# Patient Record
Sex: Female | Born: 1990 | Race: White | Hispanic: No | Marital: Single | Smoking: Never smoker
Health system: Southern US, Community
[De-identification: ages and names within clinical notes are randomized; demographics above are authoritative.]

## PROBLEM LIST (undated history)

## (undated) DIAGNOSIS — R569 Unspecified convulsions: Secondary | ICD-10-CM

## (undated) DIAGNOSIS — Q351 Cleft hard palate: Secondary | ICD-10-CM

## (undated) HISTORY — PX: CLEFT PALATE REPAIR: SUR1165

---

## 2010-01-13 ENCOUNTER — Emergency Department (HOSPITAL_COMMUNITY): Admission: EM | Admit: 2010-01-13 | Discharge: 2010-01-13 | Payer: Self-pay | Admitting: Emergency Medicine

## 2010-02-04 ENCOUNTER — Emergency Department (HOSPITAL_COMMUNITY): Admission: EM | Admit: 2010-02-04 | Discharge: 2010-02-04 | Payer: Self-pay | Admitting: Emergency Medicine

## 2010-08-09 ENCOUNTER — Emergency Department (HOSPITAL_COMMUNITY)
Admission: EM | Admit: 2010-08-09 | Discharge: 2010-08-09 | Payer: Self-pay | Source: Home / Self Care | Admitting: Family Medicine

## 2011-02-12 ENCOUNTER — Emergency Department (HOSPITAL_COMMUNITY)
Admission: EM | Admit: 2011-02-12 | Discharge: 2011-02-12 | Disposition: A | Discharge status: Home / Self Care | Payer: Self-pay | Attending: Emergency Medicine | Admitting: Emergency Medicine

## 2011-02-12 ENCOUNTER — Emergency Department (HOSPITAL_COMMUNITY): Payer: Self-pay

## 2011-02-12 DIAGNOSIS — J3489 Other specified disorders of nose and nasal sinuses: Secondary | ICD-10-CM | POA: Insufficient documentation

## 2011-02-12 DIAGNOSIS — R10816 Epigastric abdominal tenderness: Secondary | ICD-10-CM | POA: Insufficient documentation

## 2011-02-12 DIAGNOSIS — R0602 Shortness of breath: Secondary | ICD-10-CM | POA: Insufficient documentation

## 2011-02-12 DIAGNOSIS — R6889 Other general symptoms and signs: Secondary | ICD-10-CM | POA: Insufficient documentation

## 2011-02-12 DIAGNOSIS — R109 Unspecified abdominal pain: Secondary | ICD-10-CM | POA: Insufficient documentation

## 2011-02-12 DIAGNOSIS — R062 Wheezing: Secondary | ICD-10-CM | POA: Insufficient documentation

## 2011-02-12 DIAGNOSIS — R059 Cough, unspecified: Secondary | ICD-10-CM | POA: Insufficient documentation

## 2011-02-12 DIAGNOSIS — R079 Chest pain, unspecified: Secondary | ICD-10-CM | POA: Insufficient documentation

## 2011-02-12 DIAGNOSIS — R6883 Chills (without fever): Secondary | ICD-10-CM | POA: Insufficient documentation

## 2011-02-12 DIAGNOSIS — R1110 Vomiting, unspecified: Secondary | ICD-10-CM | POA: Insufficient documentation

## 2011-02-12 DIAGNOSIS — R55 Syncope and collapse: Secondary | ICD-10-CM | POA: Insufficient documentation

## 2011-02-12 DIAGNOSIS — R05 Cough: Secondary | ICD-10-CM | POA: Insufficient documentation

## 2011-02-12 DIAGNOSIS — J40 Bronchitis, not specified as acute or chronic: Secondary | ICD-10-CM | POA: Insufficient documentation

## 2011-02-12 LAB — PREGNANCY, URINE: Preg Test, Ur: NEGATIVE

## 2011-02-19 ENCOUNTER — Emergency Department (HOSPITAL_COMMUNITY)
Admission: EM | Admit: 2011-02-19 | Discharge: 2011-02-19 | Disposition: A | Discharge status: Home / Self Care | Payer: Self-pay | Attending: Emergency Medicine | Admitting: Emergency Medicine

## 2011-02-19 DIAGNOSIS — Y92009 Unspecified place in unspecified non-institutional (private) residence as the place of occurrence of the external cause: Secondary | ICD-10-CM | POA: Insufficient documentation

## 2011-02-19 DIAGNOSIS — W260XXA Contact with knife, initial encounter: Secondary | ICD-10-CM | POA: Insufficient documentation

## 2011-02-19 DIAGNOSIS — S61209A Unspecified open wound of unspecified finger without damage to nail, initial encounter: Secondary | ICD-10-CM | POA: Insufficient documentation

## 2011-03-21 ENCOUNTER — Inpatient Hospital Stay (INDEPENDENT_AMBULATORY_CARE_PROVIDER_SITE_OTHER)
Admission: RE | Admit: 2011-03-21 | Discharge: 2011-03-21 | Disposition: A | Discharge status: Home / Self Care | Payer: Self-pay | Source: Ambulatory Visit | Attending: Emergency Medicine | Admitting: Emergency Medicine

## 2011-03-21 DIAGNOSIS — Z4802 Encounter for removal of sutures: Secondary | ICD-10-CM

## 2011-07-14 ENCOUNTER — Emergency Department (INDEPENDENT_AMBULATORY_CARE_PROVIDER_SITE_OTHER)
Admission: EM | Admit: 2011-07-14 | Discharge: 2011-07-14 | Disposition: A | Discharge status: Home / Self Care | Payer: Self-pay | Source: Home / Self Care

## 2011-07-14 DIAGNOSIS — R112 Nausea with vomiting, unspecified: Secondary | ICD-10-CM

## 2011-07-14 MED ORDER — ONDANSETRON 4 MG PO TBDP
ORAL_TABLET | ORAL | Status: AC
  Filled 2011-07-14: qty 2

## 2011-07-14 MED ORDER — ONDANSETRON 4 MG PO TBDP
8.0000 mg | ORAL_TABLET | Freq: Once | ORAL | Status: AC
  Administered 2011-07-14: 8 mg via ORAL

## 2011-07-14 MED ORDER — PROMETHAZINE HCL 25 MG PO TABS: 25.0000 mg | ORAL_TABLET | Freq: Four times a day (QID) | ORAL | Status: AC | PRN

## 2011-07-14 NOTE — ED Provider Notes (Signed)
Medical screening examination/treatment/procedure(s) were performed by non-physician practitioner and as supervising physician I was immediately available for consultation/collaboration.  Wolfgang Finigan   Sosha Shepherd Charles Slayden Mennenga, MD 07/14/11 2059 

## 2011-07-14 NOTE — ED Notes (Signed)
Pt states vomiting, chills, fever, headache started 3 days ago.

## 2011-07-14 NOTE — ED Provider Notes (Signed)
History     CSN: [id number]  Arrival date & time 07/14/11  1528   None     Chief Complaint  Patient presents with  . Emesis    vomiting, fever, chills, headache    (Consider location/radiation/quality/duration/timing/severity/associated sxs/prior treatment) HPI Comments: Pt states she had onset of nausea and vomiting yesterday. Last vomited 3-4 hrs ago. Still feels nauseated. Has not had anything to eat or drink in the last few hrs. Denies abd pain or diarrhea. Has been urinating, and denies dysuria or urinary frequency. No abd pain. Has felt feverish and lightheaded.  The history is provided by the patient.    History reviewed. No pertinent past medical history.  History reviewed. No pertinent past surgical history.  History reviewed. No pertinent family history.  History  Substance Use Topics  . Smoking status: Never Smoker   . Smokeless tobacco: Not on file  . Alcohol Use: No    OB History    Grav Para Term Preterm Abortions TAB SAB Ect Mult Living                  Review of Systems  Constitutional: Positive for chills.  Respiratory: Negative for cough and shortness of breath.   Cardiovascular: Negative for chest pain.  Gastrointestinal: Positive for nausea and vomiting. Negative for abdominal pain, diarrhea, constipation and abdominal distention.  Genitourinary: Negative for dysuria, urgency and frequency.  Neurological: Positive for light-headedness and headaches. Negative for dizziness.    Allergies  Sulfa antibiotics  Home Medications   Current Outpatient Rx  Name Route Sig Dispense Refill  . IBUPROFEN 200 MG PO TABS Oral Take 200 mg by mouth every 6 (six) hours as needed.      Suzzanne Cloud ESTRADIOL 0.25-35 MG-MCG PO TABS Oral Take 1 tablet by mouth daily.      Marland Kitchen PROMETHAZINE HCL 25 MG PO TABS Oral Take 1 tablet (25 mg total) by mouth every 6 (six) hours as needed for nausea. 12 tablet 0    BP 112/54  Pulse 66  Temp(Src) 98.4 F (36.9 C)  (Oral)  Resp 16  SpO2 100%  LMP 07/13/2011  Physical Exam  Nursing note and vitals reviewed. Constitutional: She appears well-developed and well-nourished. No distress.  HENT:  Head: Normocephalic and atraumatic.  Right Ear: Tympanic membrane, external ear and ear canal normal.  Left Ear: Tympanic membrane, external ear and ear canal normal.  Nose: Nose normal.  Mouth/Throat: Uvula is midline, oropharynx is clear and moist and mucous membranes are normal. No oropharyngeal exudate, posterior oropharyngeal edema or posterior oropharyngeal erythema.  Neck: Neck supple.  Cardiovascular: Normal rate, regular rhythm and normal heart sounds.   Pulmonary/Chest: Effort normal and breath sounds normal. No respiratory distress.  Abdominal: Soft. Bowel sounds are normal. She exhibits no distension and no mass. There is no tenderness. There is no guarding.  Lymphadenopathy:    She has no cervical adenopathy.  Neurological: She is alert.  Skin: Skin is warm and dry.  Psychiatric: She has a normal mood and affect.    ED Course  Procedures (including critical care time)  Labs Reviewed - No data to display No results found.   1. Nausea & vomiting       MDM  Pt reports symptomatic improvement after receiving Zofran and is drinking gingerale.  Discharge home with rx of Phenergan, clear liquids and advance diet as tolerated.         Melody Comas, [place] 07/14/11 [phone]

## 2012-09-19 ENCOUNTER — Encounter (HOSPITAL_COMMUNITY): Payer: Self-pay | Admitting: Emergency Medicine

## 2012-09-19 DIAGNOSIS — Z79899 Other long term (current) drug therapy: Secondary | ICD-10-CM | POA: Insufficient documentation

## 2012-09-19 DIAGNOSIS — F1410 Cocaine abuse, uncomplicated: Secondary | ICD-10-CM | POA: Insufficient documentation

## 2012-09-19 DIAGNOSIS — IMO0002 Reserved for concepts with insufficient information to code with codable children: Secondary | ICD-10-CM | POA: Insufficient documentation

## 2012-09-19 DIAGNOSIS — I82619 Acute embolism and thrombosis of superficial veins of unspecified upper extremity: Secondary | ICD-10-CM | POA: Insufficient documentation

## 2012-09-19 LAB — CBC WITH DIFFERENTIAL/PLATELET
Basophils Absolute: 0 10*3/uL (ref 0.0–0.1)
Eosinophils Relative: 1 % (ref 0–5)
HCT: 38.4 % (ref 36.0–46.0)
Lymphocytes Relative: 23 % (ref 12–46)
Lymphs Abs: 1.7 10*3/uL (ref 0.7–4.0)
MCV: 85.5 fL (ref 78.0–100.0)
Monocytes Absolute: 0.8 10*3/uL (ref 0.1–1.0)
Neutro Abs: 4.9 10*3/uL (ref 1.7–7.7)
RBC: 4.49 MIL/uL (ref 3.87–5.11)
RDW: 12 % (ref 11.5–15.5)
WBC: 7.4 10*3/uL (ref 4.0–10.5)

## 2012-09-19 LAB — BASIC METABOLIC PANEL
CO2: 25 mEq/L (ref 19–32)
Calcium: 9.7 mg/dL (ref 8.4–10.5)
Chloride: 101 mEq/L (ref 96–112)
Creatinine, Ser: 0.73 mg/dL (ref 0.50–1.10)
Glucose, Bld: 89 mg/dL (ref 70–99)
Sodium: 137 mEq/L (ref 135–145)

## 2012-09-19 NOTE — ED Notes (Signed)
Patient with infection on left forearm.  Patient states the area is warm to the touch.  Patient states that she has been on an antibiotic with no relief.

## 2012-09-20 ENCOUNTER — Emergency Department (HOSPITAL_COMMUNITY)
Admission: EM | Admit: 2012-09-20 | Discharge: 2012-09-20 | Disposition: A | Discharge status: Home / Self Care | Payer: Self-pay | Attending: Emergency Medicine | Admitting: Emergency Medicine

## 2012-09-20 DIAGNOSIS — I82612 Acute embolism and thrombosis of superficial veins of left upper extremity: Secondary | ICD-10-CM

## 2012-09-20 DIAGNOSIS — F1190 Opioid use, unspecified, uncomplicated: Secondary | ICD-10-CM

## 2012-09-20 DIAGNOSIS — L03114 Cellulitis of left upper limb: Secondary | ICD-10-CM

## 2012-09-20 DIAGNOSIS — L02414 Cutaneous abscess of left upper limb: Secondary | ICD-10-CM

## 2012-09-20 MED ORDER — IBUPROFEN 800 MG PO TABS
800.0000 mg | ORAL_TABLET | Freq: Once | ORAL | Status: DC
  Filled 2012-09-20: qty 1

## 2012-09-20 MED ORDER — PROPOFOL 10 MG/ML IV EMUL
INTRAVENOUS | Status: AC
  Administered 2012-09-20: 50 mg/h
  Administered 2012-09-20: 40 mg/h via INTRAVENOUS
  Administered 2012-09-20: 60 mg
  Filled 2012-09-20: qty 100

## 2012-09-20 MED ORDER — KETOROLAC TROMETHAMINE 30 MG/ML IJ SOLN
30.0000 mg | Freq: Once | INTRAMUSCULAR | Status: AC
  Administered 2012-09-20: 30 mg via INTRAVENOUS
  Filled 2012-09-20: qty 1

## 2012-09-20 MED ORDER — DOXYCYCLINE HYCLATE 100 MG PO CAPS: 100.0000 mg | ORAL_CAPSULE | Freq: Two times a day (BID) | ORAL | Status: AC

## 2012-09-20 MED ORDER — IBUPROFEN 600 MG PO TABS: 600.0000 mg | ORAL_TABLET | Freq: Four times a day (QID) | ORAL | Status: AC | PRN

## 2012-09-20 MED ORDER — BUPIVACAINE HCL (PF) 0.5 % IJ SOLN
10.0000 mL | Freq: Once | INTRAMUSCULAR | Status: AC
  Administered 2012-09-20: 10 mL
  Filled 2012-09-20: qty 10

## 2012-09-20 MED ORDER — HYDROCODONE-ACETAMINOPHEN 5-325 MG PO TABS: 2.0000 | ORAL_TABLET | Freq: Once | ORAL | Status: DC

## 2012-09-20 NOTE — ED Notes (Signed)
Procedure finished. 

## 2012-09-20 NOTE — ED Notes (Signed)
Family updated as to patient's status.

## 2012-09-20 NOTE — ED Notes (Signed)
Vital signs stable. 

## 2012-09-20 NOTE — ED Notes (Signed)
Patient is resting comfortably. 

## 2012-09-20 NOTE — ED Notes (Signed)
Patient denies pain and is resting comfortably.  

## 2012-09-20 NOTE — ED Provider Notes (Signed)
History     CSN: [id number]  Arrival date & time 09/19/12  2222   First MD Initiated Contact with Patient 09/20/12 [phone]      Chief Complaint  Patient presents with  . Wound Infection    (Consider location/radiation/quality/duration/timing/severity/associated sxs/prior treatment) HPI Christy Gilmore is a 22 y.o. female history of prior heroin injection drug use presents with an abscess to the left forearm. She says this is been there for "days" and she was given an antibiotic by a "surgeon friend" of hers -keflex, but it has gotten worse. The pain is severe, is well localized, constant, it's hot and throbbing. She had some associated headaches, no fevers, no chills, no vomiting, no nausea, no abdominal pain. She denies using heroin recently and is looking into a methadone clinic. Patient says she's had areas of firm, nodularity in linear patterns which developed into this abscess.     History reviewed. No pertinent past medical history.  History reviewed. No pertinent past surgical history.  History reviewed. No pertinent family history.  History  Substance Use Topics  . Smoking status: Never Smoker   . Smokeless tobacco: Not on file  . Alcohol Use: No    OB History   Grav Para Term Preterm Abortions TAB SAB Ect Mult Living                  Review of Systems At least 10pt or greater review of systems completed and are negative except where specified in the HPI.  Allergies  Sulfa antibiotics  Home Medications   Current Outpatient Rx  Name  Route  Sig  Dispense  Refill  . cephALEXin (KEFLEX) 250 MG capsule   Oral   Take 250 mg by mouth 4 (four) times daily. For infected on arm         . medroxyPROGESTERone (DEPO-SUBQ PROVERA) 104 MG/0.65ML injection   Subcutaneous   Inject 104 mg into the skin every 3 (three) months.           BP 131/94  Pulse 100  Temp(Src) 98.5 F (36.9 C) (Oral)  Resp 18  SpO2 100%  LMP 09/19/2012  Physical Exam  Musculoskeletal:     Arms:   Nursing notes reviewed.  Electronic medical record reviewed. VITAL SIGNS:   Filed Vitals:   09/19/12 2226 09/20/12 0449  BP: 109/68 131/94  Pulse: 100   Temp: 97.3 F (36.3 C) 98.5 F (36.9 C)  TempSrc: Oral Oral  Resp: 16 18  SpO2: 97% 100%   CONSTITUTIONAL: Awake, oriented, appears non-toxic HENT: Atraumatic, normocephalic, oral mucosa pink and moist, airway patent. Nares patent without drainage. External ears normal. EYES: Conjunctiva clear, EOMI, PERRLA NECK: Trachea midline, non-tender, supple CARDIOVASCULAR: Normal heart rate, Normal rhythm, No murmurs, rubs, gallops PULMONARY/CHEST: Clear to auscultation, no rhonchi, wheezes, or rales. Symmetrical breath sounds. Non-tender. ABDOMINAL: Non-distended, soft, non-tender - no rebound or guarding.  BS normal. NEUROLOGIC: Non-focal, moving all four extremities, no gross sensory or motor deficits. EXTREMITIES: No clubbing, cyanosis, or edema. Half golf ball sized indurated and fluctuant area on the left proximal forearm. Distal to this area there are thrombosed superficial veins. There is erythema surrounding the golf ball area. SKIN: Warm, Dry, No erythema, No rash  ED Course  INCISION AND DRAINAGE Date/Time: 09/20/2012 7:00 AM Performed by: Jones Skene Authorized by: Jones Skene Consent: Verbal consent obtained. Consent given by: patient Patient identity confirmed: verbally with patient Time out: Immediately prior to procedure a "time out" was called to verify the  correct patient, procedure, equipment, support staff and site/side marked as required. Type: abscess Body area: upper extremity Location details: left arm Anesthesia: local infiltration Local anesthetic: lidocaine 2% with epinephrine Anesthetic total: 8 ml Patient sedated: yes Sedation type: moderate (conscious) sedation Sedatives: propofol Analgesia: local and toradol. Vitals: Vital signs were monitored during sedation. (see MAR for nursing  notes) Scalpel size: 11 Incision type: single straight Complexity: complex Drainage: purulent Drainage amount: copious Wound treatment: wound left open Patient tolerance: Patient tolerated the procedure well with no immediate complications.   (including critical care time)  Labs Reviewed  CBC WITH DIFFERENTIAL - Abnormal; Notable for the following:    MCHC 36.5 (*)    All other components within normal limits  BASIC METABOLIC PANEL   No results found.   1. Abscess of forearm, left   2. Cellulitis of forearm, left   3. Superficial venous thrombosis of arm, left   4. Heroin use       MDM  Christy Gilmore is a 22 y.o. female presenting with large abscess to left forearm likely seeded with dirty needles - pt has superficial venous thrombosis distal to abscess.  Bedside ultrasound shows multiple loculations within abscess cavity - she will likely not tolerate this procedure well w/o sedation.  Elect moderate propofol sedation.  Pt understands and agrees with risks (airway, apnea, transient drop in BP) secondary to benefits of a more comfortable procedure with amnesia and complete evacuation of abscess cavity.  Pt tolerated procedure well.  She has recovered uneventfully.  Treat as OP with keflex/doxy for surrounding cellulitis, f/u in 24-48 in urgent care or ER (both @ [facility]) for wound recheck.  Packing not indicated. No signs/symptoms c/w systemic illness or endocarditis, discitis.    I explained the diagnosis and have given explicit precautions to return to the ER including any other new or worsening symptoms. The patient understands and accepts the medical plan as it's been dictated and I have answered their questions. Discharge instructions concerning home care and prescriptions have been given.  The patient is STABLE and is discharged to home in good condition.           Jones Skene, MD 09/24/12 1610

## 2013-01-03 IMAGING — CR DG CHEST 2V
2 series · 2 of 2 positions shown · non-contrast
Comparison: None.

CLINICAL DATA: Cough, wheezing

CHEST - 2 VIEW

[w chest pa]
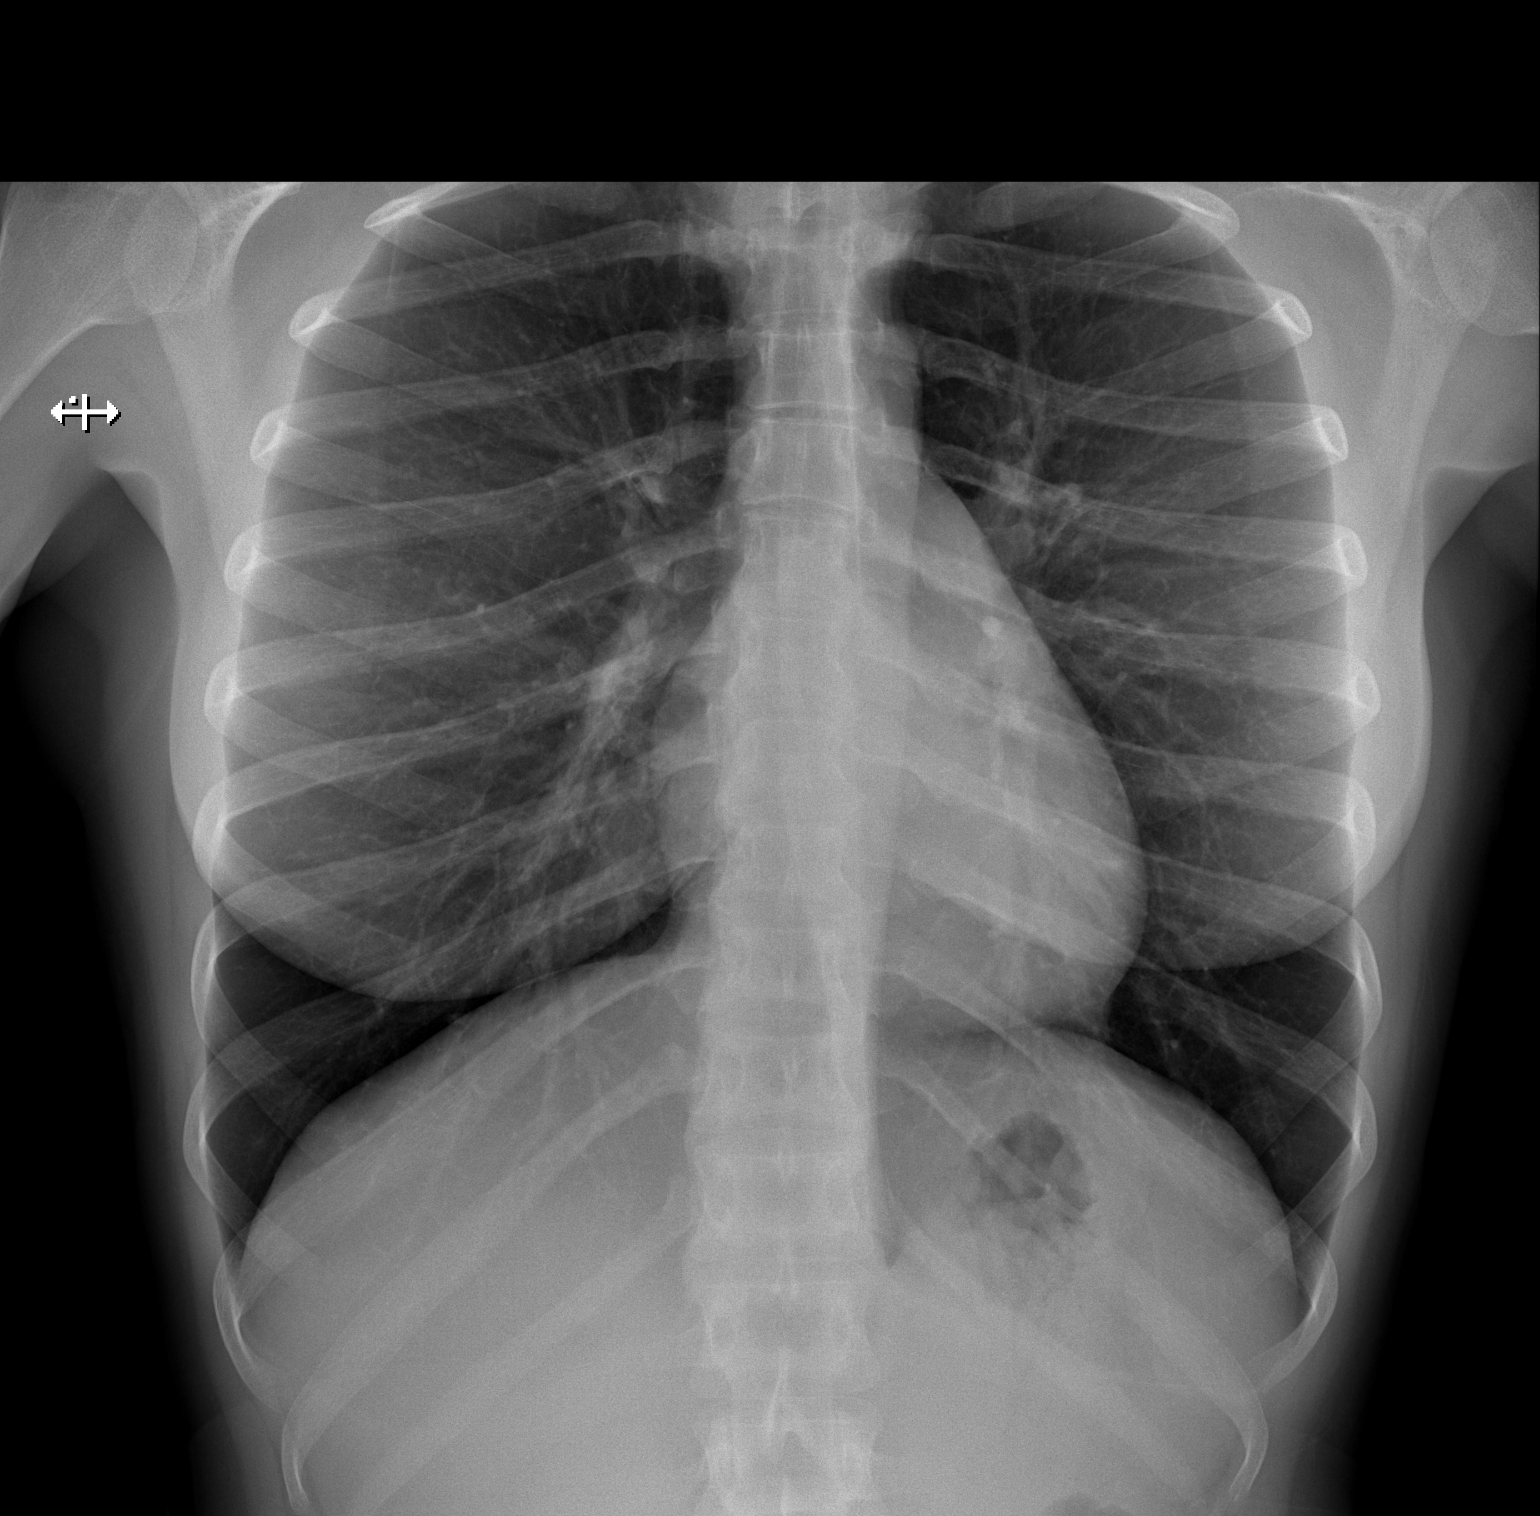

[w chest lat]
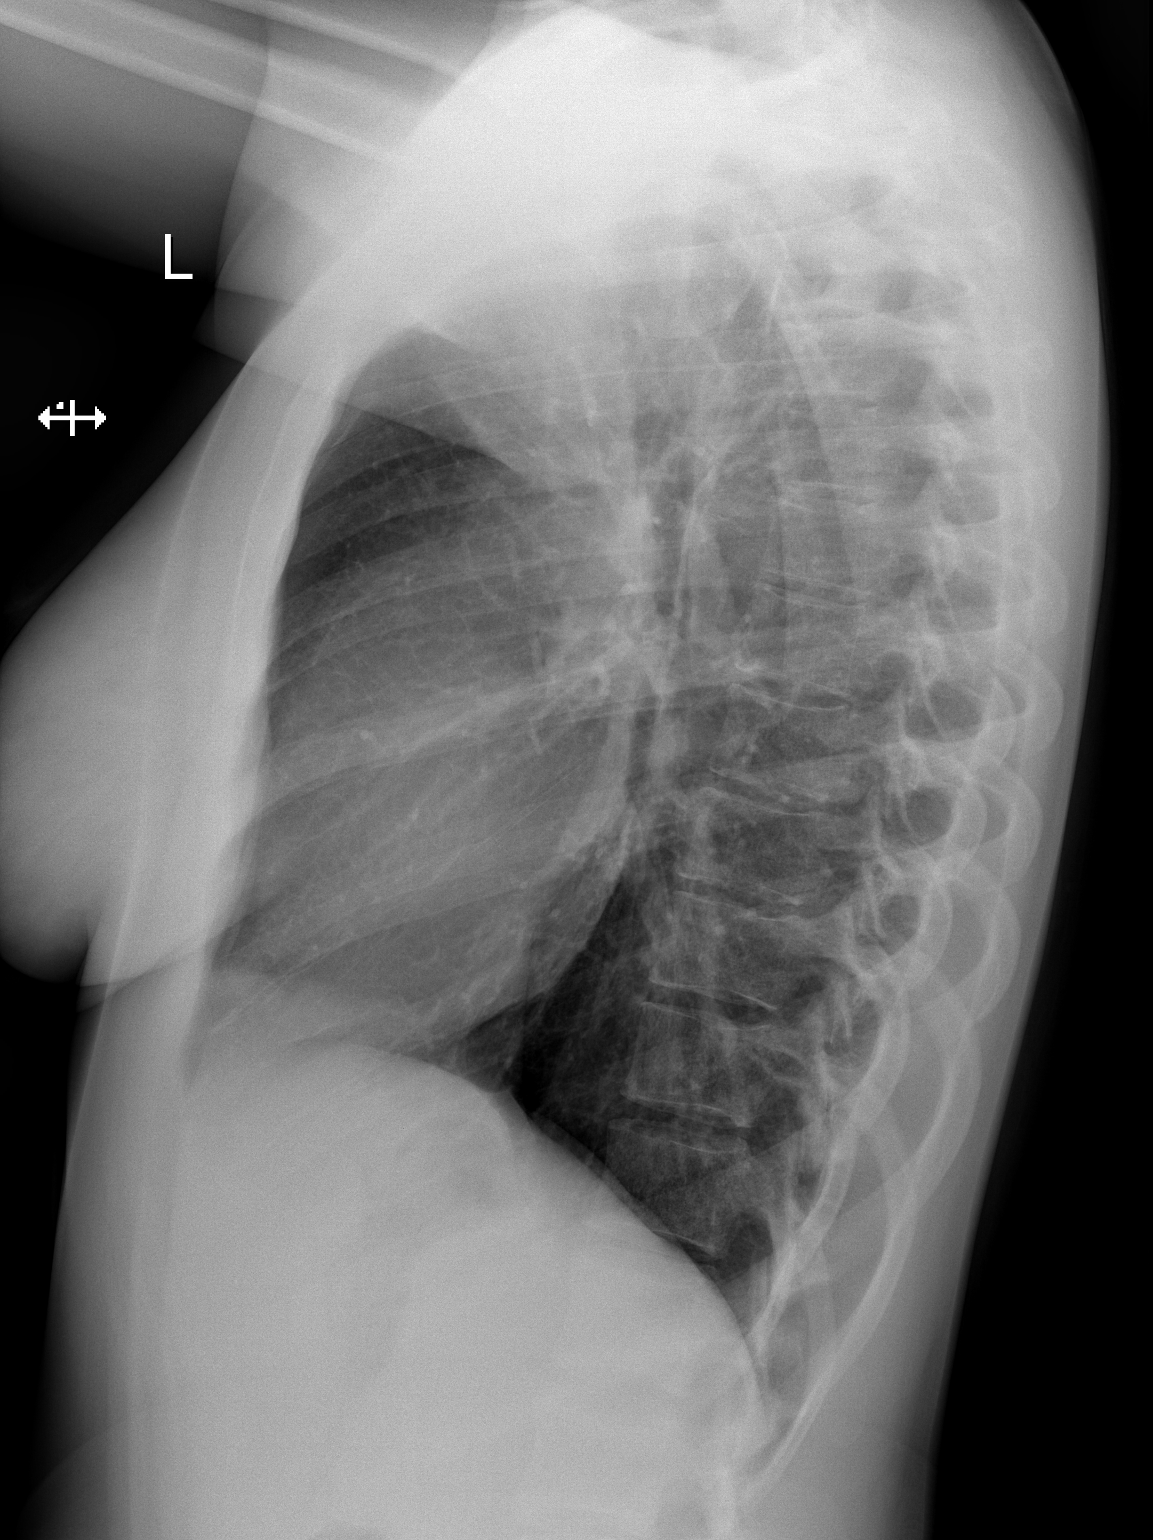

[2 of 2 positions shown; findings below may reference images not displayed]

FINDINGS: Lungs clear.  Heart size and pulmonary vascularity
normal.  No effusion.  Visualized bones unremarkable.
IMPRESSION: No acute disease

## 2014-02-10 ENCOUNTER — Emergency Department (INDEPENDENT_AMBULATORY_CARE_PROVIDER_SITE_OTHER)
Admission: EM | Admit: 2014-02-10 | Discharge: 2014-02-10 | Disposition: A | Discharge status: Home / Self Care | Payer: Self-pay | Source: Home / Self Care | Attending: Family Medicine | Admitting: Family Medicine

## 2014-02-10 ENCOUNTER — Emergency Department (INDEPENDENT_AMBULATORY_CARE_PROVIDER_SITE_OTHER): Payer: Self-pay

## 2014-02-10 ENCOUNTER — Encounter (HOSPITAL_COMMUNITY): Payer: Self-pay | Admitting: Emergency Medicine

## 2014-02-10 DIAGNOSIS — R630 Anorexia: Secondary | ICD-10-CM

## 2014-02-10 DIAGNOSIS — R112 Nausea with vomiting, unspecified: Secondary | ICD-10-CM

## 2014-02-10 HISTORY — DX: Cleft hard palate: Q35.1

## 2014-02-10 LAB — POCT PREGNANCY, URINE: PREG TEST UR: NEGATIVE

## 2014-02-10 LAB — CBC WITH DIFFERENTIAL/PLATELET
BASOS ABS: 0 10*3/uL (ref 0.0–0.1)
BASOS PCT: 0 % (ref 0–1)
EOS ABS: 0.1 10*3/uL (ref 0.0–0.7)
Eosinophils Relative: 1 % (ref 0–5)
HCT: 45.9 % (ref 36.0–46.0)
Hemoglobin: 15.6 g/dL — ABNORMAL HIGH (ref 12.0–15.0)
Lymphocytes Relative: 11 % — ABNORMAL LOW (ref 12–46)
Lymphs Abs: 0.7 10*3/uL (ref 0.7–4.0)
MCH: 30.5 pg (ref 26.0–34.0)
MCHC: 34 g/dL (ref 30.0–36.0)
MCV: 89.6 fL (ref 78.0–100.0)
Monocytes Absolute: 0.4 10*3/uL (ref 0.1–1.0)
Monocytes Relative: 7 % (ref 3–12)
NEUTROS PCT: 81 % — AB (ref 43–77)
Neutro Abs: 4.8 10*3/uL (ref 1.7–7.7)
PLATELETS: 245 10*3/uL (ref 150–400)
RBC: 5.12 MIL/uL — AB (ref 3.87–5.11)
RDW: 13 % (ref 11.5–15.5)
WBC: 5.9 10*3/uL (ref 4.0–10.5)

## 2014-02-10 LAB — COMPREHENSIVE METABOLIC PANEL
ALBUMIN: 4 g/dL (ref 3.5–5.2)
ALK PHOS: 66 U/L (ref 39–117)
ALT: 11 U/L (ref 0–35)
ANION GAP: 14 (ref 5–15)
AST: 18 U/L (ref 0–37)
BUN: 12 mg/dL (ref 6–23)
CO2: 25 mEq/L (ref 19–32)
Calcium: 9.4 mg/dL (ref 8.4–10.5)
Chloride: 100 mEq/L (ref 96–112)
Creatinine, Ser: 0.64 mg/dL (ref 0.50–1.10)
GFR calc Af Amer: >90 mL/min (ref >90)
GFR calc non Af Amer: >90 mL/min (ref >90)
Glucose, Bld: 94 mg/dL (ref 70–99)
POTASSIUM: 4.1 meq/L (ref 3.7–5.3)
SODIUM: 139 meq/L (ref 137–147)
TOTAL PROTEIN: 8.3 g/dL (ref 6.0–8.3)
Total Bilirubin: 0.7 mg/dL (ref 0.3–1.2)

## 2014-02-10 LAB — POCT URINALYSIS DIP (DEVICE)
Glucose, UA: NEGATIVE mg/dL
Hgb urine dipstick: NEGATIVE
KETONES UR: 40 mg/dL — AB
LEUKOCYTES UA: NEGATIVE
Nitrite: NEGATIVE
PROTEIN: NEGATIVE mg/dL
Specific Gravity, Urine: 1.02 (ref 1.005–1.030)
UROBILINOGEN UA: 0.2 mg/dL (ref 0.0–1.0)
pH: 7 (ref 5.0–8.0)

## 2014-02-10 LAB — POCT H PYLORI SCREEN: H. PYLORI SCREEN, POC: NEGATIVE

## 2014-02-10 MED ORDER — RANITIDINE HCL 150 MG PO CAPS: 150.0000 mg | ORAL_CAPSULE | Freq: Every day | ORAL | Status: DC

## 2014-02-10 MED ORDER — ONDANSETRON 4 MG PO TBDP
ORAL_TABLET | ORAL | Status: AC
  Filled 2014-02-10: qty 1

## 2014-02-10 MED ORDER — PROMETHAZINE HCL 25 MG PO TABS: 25.0000 mg | ORAL_TABLET | Freq: Four times a day (QID) | ORAL | Status: DC | PRN

## 2014-02-10 MED ORDER — ONDANSETRON 4 MG PO TBDP
4.0000 mg | ORAL_TABLET | Freq: Once | ORAL | Status: AC
  Administered 2014-02-10: 4 mg via ORAL

## 2014-02-10 NOTE — ED Provider Notes (Signed)
CSN: [id number]     Arrival date & time 02/10/14  1808 History   First MD Initiated Contact with Patient 02/10/14 1917     Chief Complaint  Patient presents with  . Anorexia   (Consider location/radiation/quality/duration/timing/severity/associated sxs/prior Treatment) HPI Comments: 23 year old female presents for evaluation of losing her appetite 2 weeks ago, postprandial nausea, and vomiting all day today. She says that she has been trying to smoke marijuana in order to get her appetite back but she feels like she gets full very quickly. She also occasionally gets some upper abdominal pains. She denies chest pain shortness of breath, or any appreciable weight loss. She has never had this before. She does not think she would be pregnant. She denies diarrhea or constipation. She does admit to possible depression, she says she is under a lot of stress. She also has frequent heartburn.  Denies vaginal discharge or pelvic pain. She has never had this before   Past Medical History  Diagnosis Date  . Cleft hard palate    Past Surgical History  Procedure Laterality Date  . Cleft palate repair  1992   No family history on file. History  Substance Use Topics  . Smoking status: Never Smoker   . Smokeless tobacco: Not on file  . Alcohol Use: No   OB History   Grav Para Term Preterm Abortions TAB SAB Ect Mult Living                 Review of Systems  Constitutional: Positive for appetite change and fatigue. Negative for fever, chills and unexpected weight change.  Eyes: Negative for visual disturbance.  Respiratory: Negative for cough and shortness of breath.   Cardiovascular: Negative for chest pain, palpitations and leg swelling.  Gastrointestinal: Positive for nausea, vomiting and abdominal pain.  Endocrine: Negative for polydipsia and polyuria.  Genitourinary: Negative for dysuria, urgency and frequency.  Musculoskeletal: Negative for arthralgias and myalgias.  Skin: Negative for  rash.  Neurological: Negative for dizziness, weakness and light-headedness.  All other systems reviewed and are negative.   Allergies  Sulfa antibiotics  Home Medications   Prior to Admission medications   Medication Sig Start Date End Date Taking? Authorizing Provider  cephALEXin (KEFLEX) 250 MG capsule Take 250 mg by mouth 4 (four) times daily. For infected on arm    Historical Provider, MD  doxycycline (VIBRAMYCIN) 100 MG capsule Take 1 capsule (100 mg total) by mouth 2 (two) times daily. 09/20/12   John-Adam Bonk, MD  ibuprofen (ADVIL,MOTRIN) 600 MG tablet Take 1 tablet (600 mg total) by mouth every 6 (six) hours as needed for pain. 09/20/12   John-Adam Bonk, MD  medroxyPROGESTERone (DEPO-SUBQ PROVERA) 104 MG/0.65ML injection Inject 104 mg into the skin every 3 (three) months.    Historical Provider, MD  promethazine (PHENERGAN) 25 MG tablet Take 1 tablet (25 mg total) by mouth every 6 (six) hours as needed for nausea. 07/14/11 07/21/11  Dawn Vidal Schwalbe, PA-C  promethazine (PHENERGAN) 25 MG tablet Take 1 tablet (25 mg total) by mouth every 6 (six) hours as needed for nausea or vomiting. 02/10/14   Graylon Good, PA-C  ranitidine (ZANTAC) 150 MG capsule Take 1 capsule (150 mg total) by mouth daily. 02/10/14   Adrian Blackwater Daxton Nydam, PA-C   BP 118/70  Pulse 81  Temp(Src) 97.8 F (36.6 C) (Oral)  Resp 12  Wt 161 lb (73.029 kg)  SpO2 100%  LMP 01/20/2014 Physical Exam  Nursing note and vitals reviewed. Constitutional: She  is oriented to person, place, and time. Vital signs are normal. She appears well-developed and well-nourished. She does not have a sickly appearance. She does not appear ill. No distress.  HENT:  Head: Normocephalic and atraumatic.  Right Ear: Tympanic membrane, external ear and ear canal normal.  Left Ear: Tympanic membrane, external ear and ear canal normal.  Nose: Nose normal. Right sinus exhibits no maxillary sinus tenderness and no frontal sinus tenderness. Left sinus  exhibits no maxillary sinus tenderness and no frontal sinus tenderness.  Mouth/Throat: Uvula is midline, oropharynx is clear and moist and mucous membranes are normal. No oropharyngeal exudate or posterior oropharyngeal erythema.  Eyes: Conjunctivae and EOM are normal. Pupils are equal, round, and reactive to light. Right eye exhibits no discharge. Left eye exhibits no discharge. No scleral icterus.  Neck: Trachea normal, normal range of motion and full passive range of motion without pain. Neck supple. No JVD present. No mass and no thyromegaly present.  Cardiovascular: Normal rate, regular rhythm, normal heart sounds and intact distal pulses.  Exam reveals no gallop and no friction rub.   No murmur heard. Pulmonary/Chest: Effort normal and breath sounds normal. No respiratory distress. She has no decreased breath sounds. She has no wheezes. She has no rhonchi. She has no rales. She exhibits no tenderness.  Abdominal: Soft. Normal appearance and bowel sounds are normal. She exhibits no distension and no mass. There is no hepatosplenomegaly. There is no tenderness. There is no rebound, no guarding and no CVA tenderness. No hernia.  Musculoskeletal: Normal range of motion. She exhibits no edema and no tenderness.  Lymphadenopathy:    She has no cervical adenopathy.  Neurological: She is alert and oriented to person, place, and time. She has normal strength. She displays normal reflexes. No cranial nerve deficit. She exhibits normal muscle tone. Coordination normal.  Skin: Skin is warm and dry. No rash noted. She is not diaphoretic. No erythema. No pallor.  Multiple piercings and tattoos  Psychiatric: She has a normal mood and affect. Her behavior is normal. Judgment and thought content normal.    ED Course  Procedures (including critical care time) Labs Review Labs Reviewed  CBC WITH DIFFERENTIAL - Abnormal; Notable for the following:    RBC 5.12 (*)    Hemoglobin 15.6 (*)    Neutrophils  Relative % 81 (*)    Lymphocytes Relative 11 (*)    All other components within normal limits  POCT URINALYSIS DIP (DEVICE) - Abnormal; Notable for the following:    Bilirubin Urine SMALL (*)    Ketones, ur 40 (*)    All other components within normal limits  COMPREHENSIVE METABOLIC PANEL  POCT PREGNANCY, URINE  POCT H PYLORI SCREEN    Imaging Review Dg Abd 2 Views  02/10/2014   CLINICAL DATA:  Loss of appetite. Abdominal pain. Nausea, vomiting. Abnormal bowel movements for 1 and half weeks.  EXAM: ABDOMEN - 2 VIEW  COMPARISON:  None.  FINDINGS: The bowel gas pattern is normal. There is no evidence of free air. No radio-opaque calculi or other significant radiographic abnormality is seen. There is moderate stool throughout nondilated loops of colon.  IMPRESSION: 1. Nonobstructive bowel gas pattern. 2. Moderate stool burden.   Electronically Signed   By: Rosalie GumsBeth  Brown M.D.   On: 02/10/2014 20:37     MDM   1. Anorexia   2. Nausea and vomiting, vomiting of unspecified type    Workup negative. Treat for gastritis, also Phenergan for nausea. Presents as  may be caused by depression, she has been instructed to establish primary care for evaluation of this. Followup if no improvement in a week. Monitor weight.   Meds ordered this encounter  Medications  . DISCONTD: ranitidine (ZANTAC) 150 MG capsule    Sig: Take 1 capsule (150 mg total) by mouth daily.    Dispense:  30 capsule    Refill:  0    Order Specific Question:  Supervising Provider    Answer:  Clementeen Graham, S [id number]  . ranitidine (ZANTAC) 150 MG capsule    Sig: Take 1 capsule (150 mg total) by mouth daily.    Dispense:  30 capsule    Refill:  0    Order Specific Question:  Supervising Provider    Answer:  Clementeen Graham, S [id number]  . promethazine (PHENERGAN) 25 MG tablet    Sig: Take 1 tablet (25 mg total) by mouth every 6 (six) hours as needed for nausea or vomiting.    Dispense:  10 tablet    Refill:  0    Order Specific  Question:  Supervising Provider    Answer:  Clementeen Graham, S [id number]  . ondansetron (ZOFRAN-ODT) disintegrating tablet 4 mg    Sig:      Graylon Good, PA-C 02/10/14 2139

## 2014-02-10 NOTE — ED Notes (Signed)
Pt reports loss of appetite x 1.5 weeks Sx include vomiting since 0500 today, BA, muscle stiffness, diarrhea Denies abd pain, urinary sx Reports she "missed work today and was told she needs a Dr's note" and that's why she came in today Alert w/no signs of acute distress.

## 2014-02-11 NOTE — ED Provider Notes (Signed)
Medical screening examination/treatment/procedure(s) were performed by resident physician or non-physician practitioner and as supervising physician I was immediately available for consultation/collaboration.   KINDL,JAMES DOUGLAS MD.   James D Kindl, MD 02/11/14 0841 

## 2014-05-23 ENCOUNTER — Emergency Department (HOSPITAL_COMMUNITY): Payer: Medicaid Other

## 2014-05-23 ENCOUNTER — Encounter (HOSPITAL_COMMUNITY): Payer: Self-pay | Admitting: *Deleted

## 2014-05-23 ENCOUNTER — Emergency Department (HOSPITAL_COMMUNITY)
Admission: EM | Admit: 2014-05-23 | Discharge: 2014-05-23 | Disposition: A | Discharge status: Home / Self Care | Payer: Medicaid Other | Attending: Emergency Medicine | Admitting: Emergency Medicine

## 2014-05-23 DIAGNOSIS — B9689 Other specified bacterial agents as the cause of diseases classified elsewhere: Secondary | ICD-10-CM

## 2014-05-23 DIAGNOSIS — Z792 Long term (current) use of antibiotics: Secondary | ICD-10-CM | POA: Diagnosis not present

## 2014-05-23 DIAGNOSIS — Z3A00 Weeks of gestation of pregnancy not specified: Secondary | ICD-10-CM | POA: Diagnosis not present

## 2014-05-23 DIAGNOSIS — R109 Unspecified abdominal pain: Secondary | ICD-10-CM

## 2014-05-23 DIAGNOSIS — Z8669 Personal history of other diseases of the nervous system and sense organs: Secondary | ICD-10-CM | POA: Diagnosis not present

## 2014-05-23 DIAGNOSIS — O210 Mild hyperemesis gravidarum: Secondary | ICD-10-CM | POA: Insufficient documentation

## 2014-05-23 DIAGNOSIS — N760 Acute vaginitis: Secondary | ICD-10-CM

## 2014-05-23 DIAGNOSIS — Z87738 Personal history of other specified (corrected) congenital malformations of digestive system: Secondary | ICD-10-CM | POA: Diagnosis not present

## 2014-05-23 DIAGNOSIS — Z3490 Encounter for supervision of normal pregnancy, unspecified, unspecified trimester: Secondary | ICD-10-CM

## 2014-05-23 DIAGNOSIS — R112 Nausea with vomiting, unspecified: Secondary | ICD-10-CM

## 2014-05-23 DIAGNOSIS — O23591 Infection of other part of genital tract in pregnancy, first trimester: Secondary | ICD-10-CM | POA: Insufficient documentation

## 2014-05-23 HISTORY — DX: Unspecified convulsions: R56.9

## 2014-05-23 LAB — COMPREHENSIVE METABOLIC PANEL
ALBUMIN: 3.1 g/dL — AB (ref 3.5–5.2)
ALK PHOS: 43 U/L (ref 39–117)
ALT: 8 U/L (ref 0–35)
ANION GAP: 15 (ref 5–15)
AST: 13 U/L (ref 0–37)
BILIRUBIN TOTAL: 0.2 mg/dL — AB (ref 0.3–1.2)
BUN: 7 mg/dL (ref 6–23)
CHLORIDE: 101 meq/L (ref 96–112)
CO2: 20 meq/L (ref 19–32)
CREATININE: 0.49 mg/dL — AB (ref 0.50–1.10)
Calcium: 9 mg/dL (ref 8.4–10.5)
GFR calc Af Amer: >90 mL/min (ref >90)
Glucose, Bld: 95 mg/dL (ref 70–99)
POTASSIUM: 3.8 meq/L (ref 3.7–5.3)
Sodium: 136 mEq/L — ABNORMAL LOW (ref 137–147)
Total Protein: 7.3 g/dL (ref 6.0–8.3)

## 2014-05-23 LAB — CBC WITH DIFFERENTIAL/PLATELET
BASOS ABS: 0 10*3/uL (ref 0.0–0.1)
BASOS PCT: 0 % (ref 0–1)
EOS PCT: 0 % (ref 0–5)
Eosinophils Absolute: 0 10*3/uL (ref 0.0–0.7)
HEMATOCRIT: 34.5 % — AB (ref 36.0–46.0)
Hemoglobin: 11.9 g/dL — ABNORMAL LOW (ref 12.0–15.0)
Lymphocytes Relative: 12 % (ref 12–46)
Lymphs Abs: 0.9 10*3/uL (ref 0.7–4.0)
MCH: 30 pg (ref 26.0–34.0)
MCHC: 34.5 g/dL (ref 30.0–36.0)
MCV: 86.9 fL (ref 78.0–100.0)
MONO ABS: 0.6 10*3/uL (ref 0.1–1.0)
Monocytes Relative: 8 % (ref 3–12)
Neutro Abs: 6 10*3/uL (ref 1.7–7.7)
Neutrophils Relative %: 80 % — ABNORMAL HIGH (ref 43–77)
PLATELETS: 212 10*3/uL (ref 150–400)
RBC: 3.97 MIL/uL (ref 3.87–5.11)
RDW: 13.2 % (ref 11.5–15.5)
WBC: 7.5 10*3/uL (ref 4.0–10.5)

## 2014-05-23 LAB — URINALYSIS, ROUTINE W REFLEX MICROSCOPIC
BILIRUBIN URINE: NEGATIVE
Glucose, UA: NEGATIVE mg/dL
HGB URINE DIPSTICK: NEGATIVE
KETONES UR: 15 mg/dL — AB
Leukocytes, UA: NEGATIVE
NITRITE: NEGATIVE
PROTEIN: NEGATIVE mg/dL
Specific Gravity, Urine: 1.024 (ref 1.005–1.030)
UROBILINOGEN UA: 0.2 mg/dL (ref 0.0–1.0)
pH: 6.5 (ref 5.0–8.0)

## 2014-05-23 LAB — POC URINE PREG, ED: PREG TEST UR: POSITIVE — AB

## 2014-05-23 LAB — LIPASE, BLOOD: Lipase: 32 U/L (ref 11–59)

## 2014-05-23 LAB — WET PREP, GENITAL
Trich, Wet Prep: NONE SEEN
Yeast Wet Prep HPF POC: NONE SEEN

## 2014-05-23 MED ORDER — METOCLOPRAMIDE HCL 10 MG PO TABS: 10.0000 mg | ORAL_TABLET | Freq: Four times a day (QID) | ORAL | Status: AC

## 2014-05-23 MED ORDER — METRONIDAZOLE 500 MG PO TABS: 500.0000 mg | ORAL_TABLET | Freq: Two times a day (BID) | ORAL | Status: AC

## 2014-05-23 MED ORDER — ONDANSETRON 4 MG PO TBDP
8.0000 mg | ORAL_TABLET | Freq: Once | ORAL | Status: AC
  Administered 2014-05-23: 8 mg via ORAL
  Filled 2014-05-23: qty 2

## 2014-05-23 NOTE — ED Notes (Signed)
Pt a/o x 4 on d/c with steady gait. Instructed pt on need for establishing am OBGYN Dr as soon as possible.

## 2014-05-23 NOTE — ED Notes (Signed)
Pt states nausea and vomiting for about 4 months, seen at urgent care about 3 months ago, preg test was negative. Pt continued to have nausea and vomiting, took preg test at home that was positive

## 2014-05-23 NOTE — ED Notes (Signed)
Pt c/o abdominal pain and vomiting.  Pt unsure of last menstrual cycle - she feels it was 2 or 3 months ago.  Pt states she went to [facility] several months ago for same and they told her she wasn't pregnant.  Last week pt took Pos preg test last week.

## 2014-05-23 NOTE — ED Provider Notes (Signed)
CSN: [id number]     Arrival date & time 05/23/14  1616 History   First MD Initiated Contact with Patient 05/23/14 1838     Chief Complaint  Patient presents with  . Nausea  . Abdominal Pain     (Consider location/radiation/quality/duration/timing/severity/associated sxs/prior Treatment) HPI Comments: Patient presents today with a chief complaint of abdominal pain across her lower abdomen.  She reports that the pain has been intermittent for the past month and does not radiate.  She has not taken anything for her pain.  Pain worse with palpation.  She reports that the pain is associated with nausea and vomiting.  She states that she has noticed that the nausea and vomiting is worse in the morning.  She smokes Marijuana, which she reports helps with her nausea.  She was given Zofran ODT by triage RN prior to my evaluation and feels that it helped with the nausea.  She denies diarrhea, fever, chills, urinary symptoms, or vaginal discharge.  She reports that her LMP was approximately 3 months ago.  She states that she took a Pregnancy test at home a couple of days ago and it was positive.  She denies vaginal bleeding.  She does not have a OB/GYN.   She has not had an ultrasound for this pregnancy.  She reports that this is her first pregnancy.    The history is provided by the patient.    Past Medical History  Diagnosis Date  . Cleft hard palate   . Seizures    Past Surgical History  Procedure Laterality Date  . Cleft palate repair  1992   No family history on file. History  Substance Use Topics  . Smoking status: Never Smoker   . Smokeless tobacco: Not on file  . Alcohol Use: No   OB History    No data available     Review of Systems  All other systems reviewed and are negative.     Allergies  Sulfa antibiotics  Home Medications   Prior to Admission medications   Medication Sig Start Date End Date Taking? Authorizing Provider  doxycycline (VIBRAMYCIN) 100 MG capsule Take  1 capsule (100 mg total) by mouth 2 (two) times daily. Patient not taking: Reported on 05/23/2014 09/20/12   Jones SkeneJohn-Adam Bonk, MD  ibuprofen (ADVIL,MOTRIN) 600 MG tablet Take 1 tablet (600 mg total) by mouth every 6 (six) hours as needed for pain. Patient not taking: Reported on 05/23/2014 09/20/12   Jones SkeneJohn-Adam Bonk, MD  promethazine (PHENERGAN) 25 MG tablet Take 1 tablet (25 mg total) by mouth every 6 (six) hours as needed for nausea. 07/14/11 07/21/11  Dawn Vidal SchwalbeM Sampson, PA-C   BP 100/62 mmHg  Pulse 60  Temp(Src) 98 F (36.7 C) (Oral)  Resp 17  Ht 5\' 1"  (1.549 m)  Wt 145 lb (65.772 kg)  BMI 27.41 kg/m2  SpO2 99%  LMP 02/06/2014 Physical Exam  Constitutional: She appears well-developed and well-nourished.  HENT:  Head: Normocephalic and atraumatic.  Mouth/Throat: Oropharynx is clear and moist.  Neck: Normal range of motion. Neck supple.  Cardiovascular: Normal rate, regular rhythm and normal heart sounds.   Pulmonary/Chest: Effort normal and breath sounds normal.  Abdominal: Soft. Bowel sounds are normal. She exhibits no distension and no mass. There is tenderness in the suprapubic area. There is no rebound and no guarding.  Genitourinary: Cervix exhibits no motion tenderness. Right adnexum displays no mass, no tenderness and no fullness. Left adnexum displays no mass, no tenderness and no fullness.  Yellowish/whitish colored discharge in the vaginal vault.  Cervical os is closed  Musculoskeletal: Normal range of motion.  Neurological: She is alert.  Skin: Skin is warm and dry.  Psychiatric: She has a normal mood and affect.  Nursing note and vitals reviewed.   ED Course  Procedures (including critical care time) Labs Review Labs Reviewed  WET PREP, GENITAL - Abnormal; Notable for the following:    Clue Cells Wet Prep HPF POC FEW (*)    WBC, Wet Prep HPF POC MANY (*)    All other components within normal limits  COMPREHENSIVE METABOLIC PANEL - Abnormal; Notable for the following:     Sodium 136 (*)    Creatinine, Ser 0.49 (*)    Albumin 3.1 (*)    Total Bilirubin 0.2 (*)    All other components within normal limits  CBC WITH DIFFERENTIAL - Abnormal; Notable for the following:    Hemoglobin 11.9 (*)    HCT 34.5 (*)    Neutrophils Relative % 80 (*)    All other components within normal limits  URINALYSIS, ROUTINE W REFLEX MICROSCOPIC - Abnormal; Notable for the following:    APPearance CLOUDY (*)    Ketones, ur 15 (*)    All other components within normal limits  POC URINE PREG, ED - Abnormal; Notable for the following:    Preg Test, Ur POSITIVE (*)    All other components within normal limits  GC/CHLAMYDIA PROBE AMP  LIPASE, BLOOD    Imaging Review [facility]  05/23/2014   CLINICAL DATA:  Abdominal pain.  Nausea and vomiting.  EXAM: LIMITED OBSTETRIC ULTRASOUND  FINDINGS: Number of Fetuses: 1  Heart Rate:  124 bpm  Presentation: Cephalic  Placental Location: Anterior and LEFT  Previa: No  Amniotic Fluid (Subjective):  Within normal limits.  BPD:  39.4cm 18w  0d  MATERNAL FINDINGS:  Cervix:  Appears closed.  Uterus/Adnexae:  No abnormality visualized.  IMPRESSION: Uncomplicated single intrauterine pregnancy.  This exam is performed on an emergent basis and does not comprehensively evaluate fetal size, dating, or anatomy; follow-up complete OB US should be considered if further fetal assessment is warranted.   Electronically Signed   By: Andreas NewportGeoffrey  Lamke M.D.   On: 05/23/2014 20:59     EKG Interpretation None      MDM   Final diagnoses:  Abdominal pain   Patient presents today with a chief complaint of lower abdominal pain x 1 month and nausea and vomiting.  Urine pregnancy positive in the ED.  No prior ultrasounds.  Therefore, OB ultrasound ordered to rule out an ectopic pregnancy.   Ultrasound showing an uncomplicated intrauterine pregnancy.  No adnexal tenderness or CMT on exam.  Cervical os is closed.  Nausea controlled in the ED.  Patient tolerating PO  liquids.  Labs today unremarkable.  UA is negative.  Wet prep showing clue cells.  Patient discharged home with Flagyl to treat BV.  Patient discharged home with Reglan for nausea.  Patient also given referral [facility] follow [facility].  Patient stable for discharge.  Return precautions given.    Santiago GladHeather Rocklin Soderquist, PA-C 05/25/14 [id number]  Gwyneth SproutWhitney Plunkett, MD 05/25/14 [phone]

## 2014-05-24 LAB — GC/CHLAMYDIA PROBE AMP
CT Probe RNA: NEGATIVE
GC Probe RNA: NEGATIVE

## 2014-06-08 ENCOUNTER — Encounter: Payer: Self-pay | Admitting: Family

## 2014-06-08 ENCOUNTER — Other Ambulatory Visit (HOSPITAL_COMMUNITY)
Admission: RE | Admit: 2014-06-08 | Discharge: 2014-06-08 | Disposition: A | Discharge status: Home / Self Care | Payer: Medicaid Other | Source: Ambulatory Visit | Attending: Family | Admitting: Family

## 2014-06-08 ENCOUNTER — Ambulatory Visit (INDEPENDENT_AMBULATORY_CARE_PROVIDER_SITE_OTHER): Payer: Self-pay | Admitting: Family

## 2014-06-08 VITALS — BP 116/54 | HR 98 | Wt 150.5 lb

## 2014-06-08 DIAGNOSIS — Z01419 Encounter for gynecological examination (general) (routine) without abnormal findings: Secondary | ICD-10-CM | POA: Insufficient documentation

## 2014-06-08 DIAGNOSIS — O0930 Supervision of pregnancy with insufficient antenatal care, unspecified trimester: Secondary | ICD-10-CM

## 2014-06-08 DIAGNOSIS — Z3490 Encounter for supervision of normal pregnancy, unspecified, unspecified trimester: Secondary | ICD-10-CM | POA: Insufficient documentation

## 2014-06-08 DIAGNOSIS — Z124 Encounter for screening for malignant neoplasm of cervix: Secondary | ICD-10-CM

## 2014-06-08 DIAGNOSIS — Z3492 Encounter for supervision of normal pregnancy, unspecified, second trimester: Secondary | ICD-10-CM

## 2014-06-08 LAB — POCT URINALYSIS DIP (DEVICE)
Bilirubin Urine: NEGATIVE
Glucose, UA: NEGATIVE mg/dL
Hgb urine dipstick: NEGATIVE
KETONES UR: NEGATIVE mg/dL
Nitrite: NEGATIVE
PROTEIN: NEGATIVE mg/dL
SPECIFIC GRAVITY, URINE: 1.02 (ref 1.005–1.030)
Urobilinogen, UA: 1 mg/dL (ref 0.0–1.0)
pH: 7 (ref 5.0–8.0)

## 2014-06-08 LAB — HIV ANTIBODY (ROUTINE TESTING W REFLEX): HIV: NONREACTIVE

## 2014-06-08 NOTE — Patient Instructions (Signed)
Second Trimester of Pregnancy The second trimester is from week 13 through week 28, months 4 through 6. The second trimester is often a time when you feel your best. Your body has also adjusted to being pregnant, and you begin to feel better physically. Usually, morning sickness has lessened or quit completely, you may have more energy, and you may have an increase in appetite. The second trimester is also a time when the fetus is growing rapidly. At the end of the sixth month, the fetus is about 9 inches long and weighs about 1 pounds. You will likely begin to feel the baby move (quickening) between 18 and 20 weeks of the pregnancy. BODY CHANGES Your body goes through many changes during pregnancy. The changes vary from woman to woman.   Your weight will continue to increase. You will notice your lower abdomen bulging out.  You may begin to get stretch marks on your hips, abdomen, and breasts.  You may develop headaches that can be relieved by medicines approved by your health care provider.  You may urinate more often because the fetus is pressing on your bladder.  You may develop or continue to have heartburn as a result of your pregnancy.  You may develop constipation because certain hormones are causing the muscles that push waste through your intestines to slow down.  You may develop hemorrhoids or swollen, bulging veins (varicose veins).  You may have back pain because of the weight gain and pregnancy hormones relaxing your joints between the bones in your pelvis and as a result of a shift in weight and the muscles that support your balance.  Your breasts will continue to grow and be tender.  Your gums may bleed and may be sensitive to brushing and flossing.  Dark spots or blotches (chloasma, mask of pregnancy) may develop on your face. This will likely fade after the baby is born.  A dark line from your belly button to the pubic area (linea nigra) may appear. This will likely fade  after the baby is born.  You may have changes in your hair. These can include thickening of your hair, rapid growth, and changes in texture. Some women also have hair loss during or after pregnancy, or hair that feels dry or thin. Your hair will most likely return to normal after your baby is born. WHAT TO EXPECT AT YOUR PRENATAL VISITS During a routine prenatal visit:  You will be weighed to make sure you and the fetus are growing normally.  Your blood pressure will be taken.  Your abdomen will be measured to track your baby's growth.  The fetal heartbeat will be listened to.  Any test results from the previous visit will be discussed. Your health care provider may ask you:  How you are feeling.  If you are feeling the baby move.  If you have had any abnormal symptoms, such as leaking fluid, bleeding, severe headaches, or abdominal cramping.  If you have any questions. Other tests that may be performed during your second trimester include:  Blood tests that check for:  Low iron levels (anemia).  Gestational diabetes (between 24 and 28 weeks).  Rh antibodies.  Urine tests to check for infections, diabetes, or protein in the urine.  An ultrasound to confirm the proper growth and development of the baby.  An amniocentesis to check for possible genetic problems.  Fetal screens for spina bifida and Down syndrome. HOME CARE INSTRUCTIONS   Avoid all smoking, herbs, alcohol, and unprescribed   drugs. These chemicals affect the formation and growth of the baby.  Follow your health care provider's instructions regarding medicine use. There are medicines that are either safe or unsafe to take during pregnancy.  Exercise only as directed by your health care provider. Experiencing uterine cramps is a good sign to stop exercising.  Continue to eat regular, healthy meals.  Wear a good support bra for breast tenderness.  Do not use hot tubs, steam rooms, or saunas.  Wear your  seat belt at all times when driving.  Avoid raw meat, uncooked cheese, cat litter boxes, and soil used by cats. These carry germs that can cause birth defects in the baby.  Take your prenatal vitamins.  Try taking a stool softener (if your health care provider approves) if you develop constipation. Eat more high-fiber foods, such as fresh vegetables or fruit and whole grains. Drink plenty of fluids to keep your urine clear or pale yellow.  Take warm sitz baths to soothe any pain or discomfort caused by hemorrhoids. Use hemorrhoid cream if your health care provider approves.  If you develop varicose veins, wear support hose. Elevate your feet for 15 minutes, 3-4 times a day. Limit salt in your diet.  Avoid heavy lifting, wear low heel shoes, and practice good posture.  Rest with your legs elevated if you have leg cramps or low back pain.  Visit your dentist if you have not gone yet during your pregnancy. Use a soft toothbrush to brush your teeth and be gentle when you floss.  A sexual relationship may be continued unless your health care provider directs you otherwise.  Continue to go to all your prenatal visits as directed by your health care provider. SEEK MEDICAL CARE IF:   You have dizziness.  You have mild pelvic cramps, pelvic pressure, or nagging pain in the abdominal area.  You have persistent nausea, vomiting, or diarrhea.  You have a bad smelling vaginal discharge.  You have pain with urination. SEEK IMMEDIATE MEDICAL CARE IF:   You have a fever.  You are leaking fluid from your vagina.  You have spotting or bleeding from your vagina.  You have severe abdominal cramping or pain.  You have rapid weight gain or loss.  You have shortness of breath with chest pain.  You notice sudden or extreme swelling of your face, hands, ankles, feet, or legs.  You have not felt your baby move in over an hour.  You have severe headaches that do not go away with  medicine.  You have vision changes. Document Released: 06/25/2001 Document Revised: 07/06/2013 Document Reviewed: 09/01/2012 Exit[employer] Patient Information 2015 [employer]. This information is not intended to replace advice given to you by your health care provider. Make sure you discuss any questions you have with your health care provider.  

## 2014-06-08 NOTE — Progress Notes (Signed)
   Subjective:    Christy Gilmore is a G1P0 3167w2d being seen today for her first obstetrical visit.  Her obstetrical history is significant for late to care.   Patient does intend to breast feed.  [facility] and [facility] completed for dating.   Pregnancy history fully reviewed.  Patient reports nausea and vomiting.  Filed Vitals:   06/08/14 0924  Weight: 150 lb 8 oz (68.266 kg)    HISTORY: OB History  Gravida Para Term Preterm AB SAB TAB Ectopic Multiple Living  1             # Outcome Date GA Lbr Len/2nd Weight Sex Delivery Anes PTL Lv  1 Current              Past Medical History  Diagnosis Date  . Cleft hard palate   . Seizures    Past Surgical History  Procedure Laterality Date  . Cleft palate repair  1992   History reviewed. No pertinent family history.   Exam   Wt 150 lb 8 oz (68.266 kg)  LMP 02/06/2014 Uterine Size: size equals dates  Pelvic Exam:    Perineum: No Hemorrhoids, Normal Perineum   Vulva: normal   Vagina:  normal mucosa, normal discharge, no palpable nodules   pH: Not done   Cervix: no bleeding following Pap, no cervical motion tenderness and no lesions   Adnexa: normal adnexa and no mass, fullness, tenderness   Bony Pelvis: Adequate  System: Breast:  No nipple retraction or dimpling, No nipple discharge or bleeding, No axillary or supraclavicular adenopathy, Normal to palpation without dominant masses   Skin: normal coloration and turgor, no rashes    Neurologic: Pt had slow speech during exam.     Extremities: normal strength, tone, and muscle mass   HEENT neck supple with midline trachea and thyroid without masses   Mouth/Teeth mucous membranes moist, pharynx normal without lesions   Neck supple and no masses   Cardiovascular: regular rate and rhythm, no murmurs or gallops   Respiratory:  appears well, vitals normal, no respiratory distress, acyanotic, normal RR, neck free of mass or  lymphadenopathy, chest clear, no wheezing, crepitations, rhonchi, normal symmetric air entry   Abdomen: soft, non-tender; bowel sounds normal; no masses,  no organomegaly   Urinary: urethral meatus normal      Assessment:    Pregnancy:   23 yo G1P0 at 5767w2d wks IUP Nausea and Vomiting of Pregnacy  Patient Active Problem List   Diagnosis Date Noted  . Supervision of normal pregnancy 06/08/2014        Plan:     Initial labs drawn. Prenatal vitamins. Problem list reviewed and updated. Genetic Screening discussed Quad Screen: ordered.  Ultrasound discussed; fetal survey: ordered.  Follow up in 4 weeks.  Christy Gilmore, Christy Gilmore 06/08/2014

## 2014-06-08 NOTE — Progress Notes (Signed)
US scheduled 12/1 @ 1300.

## 2014-06-10 LAB — OBSTETRIC PANEL
Antibody Screen: NEGATIVE
Basophils Absolute: 0 10*3/uL (ref 0.0–0.1)
Basophils Relative: 0 % (ref 0–1)
Eosinophils Absolute: 0.1 10*3/uL (ref 0.0–0.7)
Eosinophils Relative: 1 % (ref 0–5)
HCT: 30.7 % — ABNORMAL LOW (ref 36.0–46.0)
Hemoglobin: 10.3 g/dL — ABNORMAL LOW (ref 12.0–15.0)
Hepatitis B Surface Ag: NEGATIVE
LYMPHS ABS: 1 10*3/uL (ref 0.7–4.0)
LYMPHS PCT: 16 % (ref 12–46)
MCH: 29.8 pg (ref 26.0–34.0)
MCHC: 33.6 g/dL (ref 30.0–36.0)
MCV: 88.7 fL (ref 78.0–100.0)
MPV: 9.9 fL (ref 9.4–12.4)
Monocytes Absolute: 0.5 10*3/uL (ref 0.1–1.0)
Monocytes Relative: 8 % (ref 3–12)
NEUTROS PCT: 75 % (ref 43–77)
Neutro Abs: 4.6 10*3/uL (ref 1.7–7.7)
PLATELETS: 233 10*3/uL (ref 150–400)
RBC: 3.46 MIL/uL — AB (ref 3.87–5.11)
RDW: 14.1 % (ref 11.5–15.5)
RUBELLA: 0.24 {index} (ref <0.90)
Rh Type: POSITIVE
WBC: 6.1 10*3/uL (ref 4.0–10.5)

## 2014-06-11 LAB — CULTURE, OB URINE

## 2014-06-13 LAB — AFP, QUAD SCREEN
AFP: 56.2 ng/mL
Age Alone: 1:1120 {titer}
Curr Gest Age: 17.3 wks.days
Down Syndrome Scr Risk Est: 1:38500 {titer}
HCG, Total: 14.11 IU/mL
INH: 158.9 pg/mL
Interpretation-AFP: NEGATIVE
MOM FOR HCG: 0.47
MoM for AFP: 1.37
MoM for INH: 0.94
Open Spina bifida: NEGATIVE
TRI 18 SCR RISK EST: NEGATIVE
uE3 Mom: 2.09
uE3 Value: 2.3 ng/mL

## 2014-06-13 LAB — CYTOLOGY - PAP

## 2014-06-14 ENCOUNTER — Ambulatory Visit (HOSPITAL_COMMUNITY)
Admission: RE | Admit: 2014-06-14 | Discharge: 2014-06-14 | Disposition: A | Discharge status: Home / Self Care | Payer: Medicaid Other | Source: Ambulatory Visit | Attending: Family | Admitting: Family

## 2014-06-14 DIAGNOSIS — O0932 Supervision of pregnancy with insufficient antenatal care, second trimester: Secondary | ICD-10-CM | POA: Insufficient documentation

## 2014-06-14 DIAGNOSIS — Z36 Encounter for antenatal screening of mother: Secondary | ICD-10-CM | POA: Diagnosis not present

## 2014-06-14 DIAGNOSIS — Z3A20 20 weeks gestation of pregnancy: Secondary | ICD-10-CM | POA: Insufficient documentation

## 2014-06-14 DIAGNOSIS — Z3492 Encounter for supervision of normal pregnancy, unspecified, second trimester: Secondary | ICD-10-CM

## 2014-06-14 LAB — OPIATES/OPIOIDS (LC/MS-MS)
Codeine Urine: 793 ng/mL — AB (ref <50)
HYDROMORPHONE: 338 ng/mL — AB (ref <50)
Hydrocodone: NEGATIVE ng/mL (ref <50)
Morphine Urine: 70537 ng/mL — AB (ref <50)
Norhydrocodone, Ur: NEGATIVE ng/mL (ref <50)
Noroxycodone, Ur: NEGATIVE ng/mL (ref <50)
OXYCODONE, UR: NEGATIVE ng/mL (ref <50)
Oxymorphone: NEGATIVE ng/mL (ref <50)

## 2014-06-14 LAB — CANNABANOIDS (GC/LC/MS), URINE: THC-COOH (GC/LC/MS), ur confirm: 7865 ng/mL — AB (ref <5)

## 2014-06-15 ENCOUNTER — Encounter: Payer: Self-pay | Admitting: Family

## 2014-06-15 ENCOUNTER — Other Ambulatory Visit: Payer: Self-pay | Admitting: Family

## 2014-06-15 DIAGNOSIS — Z283 Underimmunization status: Secondary | ICD-10-CM | POA: Insufficient documentation

## 2014-06-15 DIAGNOSIS — Z1389 Encounter for screening for other disorder: Secondary | ICD-10-CM | POA: Insufficient documentation

## 2014-06-15 DIAGNOSIS — O9989 Other specified diseases and conditions complicating pregnancy, childbirth and the puerperium: Secondary | ICD-10-CM

## 2014-06-15 DIAGNOSIS — Z3A20 20 weeks gestation of pregnancy: Secondary | ICD-10-CM | POA: Insufficient documentation

## 2014-06-15 DIAGNOSIS — O99322 Drug use complicating pregnancy, second trimester: Secondary | ICD-10-CM | POA: Insufficient documentation

## 2014-06-15 DIAGNOSIS — Z2839 Other underimmunization status: Secondary | ICD-10-CM | POA: Insufficient documentation

## 2014-06-15 LAB — PRESCRIPTION MONITORING PROFILE (19 PANEL)
AMPHETAMINE/METH: NEGATIVE ng/mL
BUPRENORPHINE, URINE: NEGATIVE ng/mL
Barbiturate Screen, Urine: NEGATIVE ng/mL
Benzodiazepine Screen, Urine: NEGATIVE ng/mL
COCAINE METABOLITES: NEGATIVE ng/mL
Carisoprodol, Urine: NEGATIVE ng/mL
Creatinine, Urine: 213.09 mg/dL (ref >20.0)
FENTANYL URINE: NEGATIVE ng/mL
MDMA URINE: NEGATIVE ng/mL
METHADONE SCREEN, URINE: NEGATIVE ng/mL
METHAQUALONE SCREEN (URINE): NEGATIVE ng/mL
Meperidine, Ur: NEGATIVE ng/mL
Nitrites, Initial: NEGATIVE ug/mL
Oxycodone Screen, Ur: NEGATIVE ng/mL
Phencyclidine, Ur: NEGATIVE ng/mL
Propoxyphene: NEGATIVE ng/mL
TAPENTADOLUR: NEGATIVE ng/mL
Tramadol Scrn, Ur: NEGATIVE ng/mL
Zolpidem, Urine: NEGATIVE ng/mL
pH, Initial: 7.4 pH (ref 4.5–8.9)

## 2014-06-15 MED ORDER — CEPHALEXIN 500 MG PO CAPS: 500.0000 mg | ORAL_CAPSULE | Freq: Three times a day (TID) | ORAL | Status: DC

## 2014-06-15 NOTE — Progress Notes (Signed)
Left message on voicemail for patient to call clinic.  When patient calls inform that +[facility]I and RX [facility]

## 2014-07-06 ENCOUNTER — Encounter: Payer: Self-pay | Admitting: Obstetrics and Gynecology

## 2014-07-06 ENCOUNTER — Ambulatory Visit (INDEPENDENT_AMBULATORY_CARE_PROVIDER_SITE_OTHER): Payer: Medicaid Other | Admitting: Obstetrics and Gynecology

## 2014-07-06 VITALS — BP 115/52 | HR 69 | Temp 98.4°F | Wt 153.1 lb

## 2014-07-06 DIAGNOSIS — O0930 Supervision of pregnancy with insufficient antenatal care, unspecified trimester: Secondary | ICD-10-CM

## 2014-07-06 DIAGNOSIS — Z3492 Encounter for supervision of normal pregnancy, unspecified, second trimester: Secondary | ICD-10-CM

## 2014-07-06 LAB — POCT URINALYSIS DIP (DEVICE)
BILIRUBIN URINE: NEGATIVE
Glucose, UA: NEGATIVE mg/dL
HGB URINE DIPSTICK: NEGATIVE
Ketones, ur: NEGATIVE mg/dL
Leukocytes, UA: NEGATIVE
NITRITE: NEGATIVE
PH: 6 (ref 5.0–8.0)
Protein, ur: NEGATIVE mg/dL
SPECIFIC GRAVITY, URINE: 1.025 (ref 1.005–1.030)
Urobilinogen, UA: 0.2 mg/dL (ref 0.0–1.0)

## 2014-07-06 NOTE — Progress Notes (Signed)
Reviewed results US and labs. Kluyvera ASB> advised to pick up Keflex at pharmacy. Admits to using painkillers but has stopped. Has stopped marijuana which she was using for nausea. N/V much improved now. Explained risk of illiciit drugs and we will check UDS periodically> check next visit. Discussed dating criteria: BEGA by anatomy US since prior US was BPD only.

## 2014-07-06 NOTE — Progress Notes (Signed)
Edema- ankles  Pressure- belly  Pain-cramps Pt reports having x1 episode at work of braxton hick contraction I informed pt that she has a UTI and that the Rx of Keflex was sent to her pharmacy to please pick up.  Pt agreed.

## 2014-08-03 ENCOUNTER — Ambulatory Visit (INDEPENDENT_AMBULATORY_CARE_PROVIDER_SITE_OTHER): Payer: Medicaid Other | Admitting: Physician Assistant

## 2014-08-03 VITALS — BP 105/63 | HR 60 | Wt 154.9 lb

## 2014-08-03 DIAGNOSIS — F1910 Other psychoactive substance abuse, uncomplicated: Secondary | ICD-10-CM

## 2014-08-03 DIAGNOSIS — O99322 Drug use complicating pregnancy, second trimester: Secondary | ICD-10-CM

## 2014-08-03 LAB — POCT URINALYSIS DIP (DEVICE)
Bilirubin Urine: NEGATIVE
Glucose, UA: NEGATIVE mg/dL
Hgb urine dipstick: NEGATIVE
KETONES UR: NEGATIVE mg/dL
Nitrite: NEGATIVE
PROTEIN: NEGATIVE mg/dL
Specific Gravity, Urine: 1.025 (ref 1.005–1.030)
UROBILINOGEN UA: 0.2 mg/dL (ref 0.0–1.0)
pH: 6 (ref 5.0–8.0)

## 2014-08-03 LAB — CBC
HEMATOCRIT: 30 % — AB (ref 36.0–46.0)
Hemoglobin: 10.1 g/dL — ABNORMAL LOW (ref 12.0–15.0)
MCH: 30.1 pg (ref 26.0–34.0)
MCHC: 33.7 g/dL (ref 30.0–36.0)
MCV: 89.3 fL (ref 78.0–100.0)
MPV: 10.1 fL (ref 8.6–12.4)
Platelets: 211 10*3/uL (ref 150–400)
RBC: 3.36 MIL/uL — ABNORMAL LOW (ref 3.87–5.11)
RDW: 13.5 % (ref 11.5–15.5)
WBC: 6.8 10*3/uL (ref 4.0–10.5)

## 2014-08-03 NOTE — Progress Notes (Signed)
I have seen and evaluated the patient with the PA student. I agree with the assessment and plan as written.  Wet prep is pending as well as UDS.  U/S ordered to eval for S<D. Pt was seen by SW today to create plan for rehab/discontinuation of drug use.  Pt was vague on medications being used.   Bertram DenverKaren E Teague Clark, PA-C  08/03/2014 12:14 PM

## 2014-08-03 NOTE — Progress Notes (Signed)
Pt would like to be checked for bacterial infection again, she notices an odor.  Pt undecided on tdap vaccine, would like to do some research. Given VIS.

## 2014-08-03 NOTE — Progress Notes (Signed)
HPI: HS is a 24 y.o. G1P0 6545w3d presents for routine follow up. States she is concerned about fetal growth because she "doesn't look like other pregnant women." Also is feeling anxious and concerned about her opioid use. Admitted to using painkillers, e.g. Opioids, regularly and that she is "physically dependent." She obtains the painkillers illegally. She is interested in discontinuing the use of opioids, has researched cymboxin, and wants to be put on cymboxin. Also admitted to marijuana use to "take the edge" off the nausea.   Positive fetal movements. Nausea and vomiting daily (~5 vomiting episodes, most times after sleeping or laying down). Denies bloody vaginal discharge and contractions.   Of note she is also concerned about a reoccurrence of bacterial vaginosis. Has malodorous urine, denies hematuria and dysuria.   ROS: 24 y.o. G1P0. 6645w3d is well developed in no acute distress.  Constitutional: denies fever, chills, and nightsweats HEENT: denies headaches; some changes in vision (e.g. Blurry vision) she attributes to pregnancy, does not wear glasses or contacts.  CV: denies chest pain and palpitations PULM: denies SOB or cough GI: +nausea and vomiting (5x/day), denies hematochezia, denies diarrhea, regular BM, no blood in BM.  GU: voiding regularly, malodorous urine, denies dysuria and hematuria  Physical exam: Heent: PERRL CV: heart sounds nml PULM: breath sounds nml GI: nontender abdomen Pelvic: white frothy discharge; no masses or lesions observed; cervix is closed; no cervical motion tenderness; no adenxa tenderness MSK: nml ROM  Assessment: 1. 5445w3d IUP 2. Substance abuse 3. Fetal growth size less than dates   Plan: 1. Order follow up US to assess fetal growth 2. Order urine drug screen 3. Consult SW about options for discontinuing improper use of medications.  4. Follow up in clinic in 1 week with MD to discuss medication misuse.   Arvilla MeresMeyer, Ashley PA-S 08/03/14 @  1100

## 2014-08-03 NOTE — Patient Instructions (Signed)

## 2014-08-04 LAB — DRUG SCREEN, URINE
Amphetamine Screen, Ur: NEGATIVE
BARBITURATE QUANT UR: NEGATIVE
BENZODIAZEPINES.: NEGATIVE
COCAINE METABOLITES: NEGATIVE
CREATININE, U: 229.75 mg/dL
Marijuana Metabolite: POSITIVE — AB
Methadone: NEGATIVE
Opiates: POSITIVE — AB
Phencyclidine (PCP): NEGATIVE
Propoxyphene: NEGATIVE

## 2014-08-04 LAB — WET PREP, GENITAL
Trich, Wet Prep: NONE SEEN
Yeast Wet Prep HPF POC: NONE SEEN

## 2014-08-04 LAB — GLUCOSE TOLERANCE, 1 HOUR (50G) W/O FASTING: GLUCOSE 1 HOUR GTT: 89 mg/dL (ref 70–140)

## 2014-08-04 LAB — RPR

## 2014-08-04 LAB — HIV ANTIBODY (ROUTINE TESTING W REFLEX): HIV 1&2 Ab, 4th Generation: NONREACTIVE

## 2014-08-09 ENCOUNTER — Ambulatory Visit (HOSPITAL_COMMUNITY)
Admission: RE | Admit: 2014-08-09 | Discharge: 2014-08-09 | Disposition: A | Discharge status: Home / Self Care | Payer: Medicaid Other | Source: Ambulatory Visit | Attending: Physician Assistant | Admitting: Physician Assistant

## 2014-08-09 DIAGNOSIS — O26849 Uterine size-date discrepancy, unspecified trimester: Secondary | ICD-10-CM | POA: Insufficient documentation

## 2014-08-09 DIAGNOSIS — O26843 Uterine size-date discrepancy, third trimester: Secondary | ICD-10-CM | POA: Diagnosis not present

## 2014-08-09 DIAGNOSIS — O0933 Supervision of pregnancy with insufficient antenatal care, third trimester: Secondary | ICD-10-CM | POA: Diagnosis not present

## 2014-08-09 DIAGNOSIS — O99322 Drug use complicating pregnancy, second trimester: Secondary | ICD-10-CM

## 2014-08-09 DIAGNOSIS — Z3A28 28 weeks gestation of pregnancy: Secondary | ICD-10-CM | POA: Diagnosis not present

## 2014-08-10 ENCOUNTER — Ambulatory Visit (INDEPENDENT_AMBULATORY_CARE_PROVIDER_SITE_OTHER): Payer: Medicaid Other | Admitting: Advanced Practice Midwife

## 2014-08-10 ENCOUNTER — Encounter: Payer: Self-pay | Admitting: Advanced Practice Midwife

## 2014-08-10 ENCOUNTER — Telehealth: Payer: Self-pay | Admitting: Advanced Practice Midwife

## 2014-08-10 VITALS — BP 110/63 | HR 86 | Wt 150.1 lb

## 2014-08-10 DIAGNOSIS — O99322 Drug use complicating pregnancy, second trimester: Secondary | ICD-10-CM

## 2014-08-10 DIAGNOSIS — Z3492 Encounter for supervision of normal pregnancy, unspecified, second trimester: Secondary | ICD-10-CM

## 2014-08-10 LAB — POCT URINALYSIS DIP (DEVICE)
Glucose, UA: NEGATIVE mg/dL
Nitrite: NEGATIVE
Protein, ur: 100 mg/dL — AB
Specific Gravity, Urine: 1.025 (ref 1.005–1.030)
Urobilinogen, UA: 1 mg/dL (ref 0.0–1.0)
pH: 7 (ref 5.0–8.0)

## 2014-08-10 NOTE — Patient Instructions (Signed)
Third Trimester of Pregnancy The third trimester is from week 29 through week 42, months 7 through 9. The third trimester is a time when the fetus is growing rapidly. At the end of the ninth month, the fetus is about 20 inches in length and weighs 6-10 pounds.  BODY CHANGES Your body goes through many changes during pregnancy. The changes vary from woman to woman.   Your weight will continue to increase. You can expect to gain 25-35 pounds (11-16 kg) by the end of the pregnancy.  You may begin to get stretch marks on your hips, abdomen, and breasts.  You may urinate more often because the fetus is moving lower into your pelvis and pressing on your bladder.  You may develop or continue to have heartburn as a result of your pregnancy.  You may develop constipation because certain hormones are causing the muscles that push waste through your intestines to slow down.  You may develop hemorrhoids or swollen, bulging veins (varicose veins).  You may have pelvic pain because of the weight gain and pregnancy hormones relaxing your joints between the bones in your pelvis. Backaches may result from overexertion of the muscles supporting your posture.  You may have changes in your hair. These can include thickening of your hair, rapid growth, and changes in texture. Some women also have hair loss during or after pregnancy, or hair that feels dry or thin. Your hair will most likely return to normal after your baby is born.  Your breasts will continue to grow and be tender. A yellow discharge may leak from your breasts called colostrum.  Your belly button may stick out.  You may feel short of breath because of your expanding uterus.  You may notice the fetus "dropping," or moving lower in your abdomen.  You may have a bloody mucus discharge. This usually occurs a few days to a week before labor begins.  Your cervix becomes thin and soft (effaced) near your due date. WHAT TO EXPECT AT YOUR PRENATAL  EXAMS  You will have prenatal exams every 2 weeks until week 36. Then, you will have weekly prenatal exams. During a routine prenatal visit:  You will be weighed to make sure you and the fetus are growing normally.  Your blood pressure is taken.  Your abdomen will be measured to track your baby's growth.  The fetal heartbeat will be listened to.  Any test results from the previous visit will be discussed.  You may have a cervical check near your due date to see if you have effaced. At around 36 weeks, your caregiver will check your cervix. At the same time, your caregiver will also perform a test on the secretions of the vaginal tissue. This test is to determine if a type of bacteria, Group B streptococcus, is present. Your caregiver will explain this further. Your caregiver may ask you:  What your birth plan is.  How you are feeling.  If you are feeling the baby move.  If you have had any abnormal symptoms, such as leaking fluid, bleeding, severe headaches, or abdominal cramping.  If you have any questions. Other tests or screenings that may be performed during your third trimester include:  Blood tests that check for low iron levels (anemia).  Fetal testing to check the health, activity level, and growth of the fetus. Testing is done if you have certain medical conditions or if there are problems during the pregnancy. FALSE LABOR You may feel small, irregular contractions that   eventually go away. These are called Braxton Hicks contractions, or false labor. Contractions may last for hours, days, or even weeks before true labor sets in. If contractions come at regular intervals, intensify, or become painful, it is best to be seen by your caregiver.  SIGNS OF LABOR   Menstrual-like cramps.  Contractions that are 5 minutes apart or less.  Contractions that start on the top of the uterus and spread down to the lower abdomen and back.  A sense of increased pelvic pressure or back  pain.  A watery or bloody mucus discharge that comes from the vagina. If you have any of these signs before the 37th week of pregnancy, call your caregiver right away. You need to go to the hospital to get checked immediately. HOME CARE INSTRUCTIONS   Avoid all smoking, herbs, alcohol, and unprescribed drugs. These chemicals affect the formation and growth of the baby.  Follow your caregiver's instructions regarding medicine use. There are medicines that are either safe or unsafe to take during pregnancy.  Exercise only as directed by your caregiver. Experiencing uterine cramps is a good sign to stop exercising.  Continue to eat regular, healthy meals.  Wear a good support bra for breast tenderness.  Do not use hot tubs, steam rooms, or saunas.  Wear your seat belt at all times when driving.  Avoid raw meat, uncooked cheese, cat litter boxes, and soil used by cats. These carry germs that can cause birth defects in the baby.  Take your prenatal vitamins.  Try taking a stool softener (if your caregiver approves) if you develop constipation. Eat more high-fiber foods, such as fresh vegetables or fruit and whole grains. Drink plenty of fluids to keep your urine clear or pale yellow.  Take warm sitz baths to soothe any pain or discomfort caused by hemorrhoids. Use hemorrhoid cream if your caregiver approves.  If you develop varicose veins, wear support hose. Elevate your feet for 15 minutes, 3-4 times a day. Limit salt in your diet.  Avoid heavy lifting, wear low heal shoes, and practice good posture.  Rest a lot with your legs elevated if you have leg cramps or low back pain.  Visit your dentist if you have not gone during your pregnancy. Use a soft toothbrush to brush your teeth and be gentle when you floss.  A sexual relationship may be continued unless your caregiver directs you otherwise.  Do not travel far distances unless it is absolutely necessary and only with the approval  of your caregiver.  Take prenatal classes to understand, practice, and ask questions about the labor and delivery.  Make a trial run to the hospital.  Pack your hospital bag.  Prepare the baby's nursery.  Continue to go to all your prenatal visits as directed by your caregiver. SEEK MEDICAL CARE IF:  You are unsure if you are in labor or if your water has broken.  You have dizziness.  You have mild pelvic cramps, pelvic pressure, or nagging pain in your abdominal area.  You have persistent nausea, vomiting, or diarrhea.  You have a bad smelling vaginal discharge.  You have pain with urination. SEEK IMMEDIATE MEDICAL CARE IF:   You have a fever.  You are leaking fluid from your vagina.  You have spotting or bleeding from your vagina.  You have severe abdominal cramping or pain.  You have rapid weight loss or gain.  You have shortness of breath with chest pain.  You notice sudden or extreme swelling   of your face, hands, ankles, feet, or legs.  You have not felt your baby move in over an hour.  You have severe headaches that do not go away with medicine.  You have vision changes. Document Released: 06/25/2001 Document Revised: 07/06/2013 Document Reviewed: 09/01/2012 [employer] Patient Information 2015 [employer]. This information is not intended to replace advice given to you by your health care provider. Make sure you discuss any questions you have with your health care provider.  

## 2014-08-10 NOTE — Progress Notes (Signed)
Pt discussed getting off opioids today with Child psychotherapistsocial worker.  Social worker will follow up with pt on what type of clinic she can be referred to.Pt wants to do Suboxone. States she lives with FOB but wants to move back to [place] to be with her family for support. Encouraged increased fluids. Will send urine for culture.

## 2014-08-10 NOTE — Telephone Encounter (Signed)
Called patient , due to no personalized voicemail left message for her to call clinics. Mailing certified letter to patient.

## 2014-08-13 LAB — CULTURE, OB URINE

## 2014-08-16 ENCOUNTER — Other Ambulatory Visit (HOSPITAL_COMMUNITY): Payer: Self-pay | Admitting: Advanced Practice Midwife

## 2014-08-16 ENCOUNTER — Telehealth: Payer: Self-pay

## 2014-08-16 MED ORDER — CEPHALEXIN 500 MG PO CAPS: 500.0000 mg | ORAL_CAPSULE | Freq: Four times a day (QID) | ORAL | Status: AC

## 2014-08-16 NOTE — Progress Notes (Signed)
+   E. Coli in UTI  Rx Keflex x 7 days.  Aviva SignsMarie L Maraya Gwilliam, CNM

## 2014-08-16 NOTE — Telephone Encounter (Signed)
-----   Message from Aviva SignsMarie L Williams, CNM sent at 08/16/2014  4:55 AM EST ----- Regarding: UTI I put in a RX for Keflex for her UTI  Can you let her know?    Sent to hr pharmacy.  Hilda LiasMarie

## 2014-08-16 NOTE — Telephone Encounter (Signed)
Called patient. No answer. Left message informing her of RX at pharmacy for UTI and to call clinic with any questions or concerns.

## 2014-08-24 ENCOUNTER — Encounter: Payer: Medicaid Other | Admitting: Advanced Practice Midwife

## 2014-09-05 ENCOUNTER — Ambulatory Visit (INDEPENDENT_AMBULATORY_CARE_PROVIDER_SITE_OTHER): Payer: Medicaid Other | Admitting: Obstetrics and Gynecology

## 2014-09-05 VITALS — BP 113/62 | HR 61 | Temp 97.6°F | Wt 144.3 lb

## 2014-09-05 DIAGNOSIS — O26843 Uterine size-date discrepancy, third trimester: Secondary | ICD-10-CM

## 2014-09-05 DIAGNOSIS — Z3493 Encounter for supervision of normal pregnancy, unspecified, third trimester: Secondary | ICD-10-CM

## 2014-09-05 LAB — POCT URINALYSIS DIP (DEVICE)
Glucose, UA: NEGATIVE mg/dL
Ketones, ur: NEGATIVE mg/dL
Nitrite: POSITIVE — AB
Protein, ur: 30 mg/dL — AB
Specific Gravity, Urine: 1.02 (ref 1.005–1.030)
Urobilinogen, UA: 2 mg/dL — ABNORMAL HIGH (ref 0.0–1.0)
pH: 7.5 (ref 5.0–8.0)

## 2014-09-05 NOTE — Progress Notes (Signed)
G1P0 at 5713w1d here for routine visit. Doing well today. No concerns or complaints. Moving to [place] on Wednesday. Needs medical records. Has appointment with new practitioner set up from Friday this week.  1. Routine PNC. Lab work reviewed. FM/PTL precautions reviewed.   2. Size < dates. Growth ultrasound 3 weeks ago with EFW 41%ile. Would recommend repeat growth ultrasound once in [place]. Discussed with patient.

## 2014-09-05 NOTE — Progress Notes (Signed)
C/o pelvic pressure and occasional contraction.  Patient will be moving to [place] in 2 days.

## 2014-09-07 ENCOUNTER — Encounter: Payer: Self-pay | Admitting: *Deleted

## 2015-04-13 ENCOUNTER — Encounter (HOSPITAL_COMMUNITY): Payer: Self-pay | Admitting: *Deleted

## 2016-04-13 IMAGING — US US OB LIMITED
1 series · 14 of 28 positions shown · non-contrast
Comparison: none

CLINICAL DATA: Abdominal pain.  Nausea and vomiting.

EXAM:
LIMITED OBSTETRIC ULTRASOUND

[Series 1: us ob limited · 0.22mm/px · 29 acquisitions, 14 frames shown]
[im 2/29]
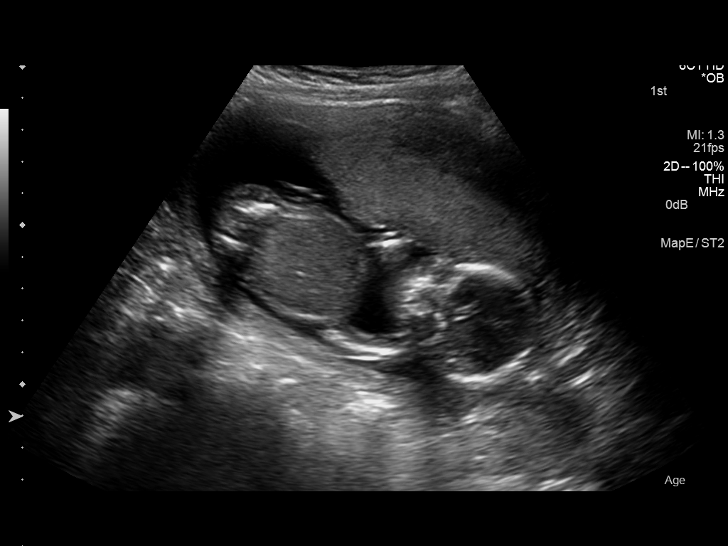
[im 4/29]
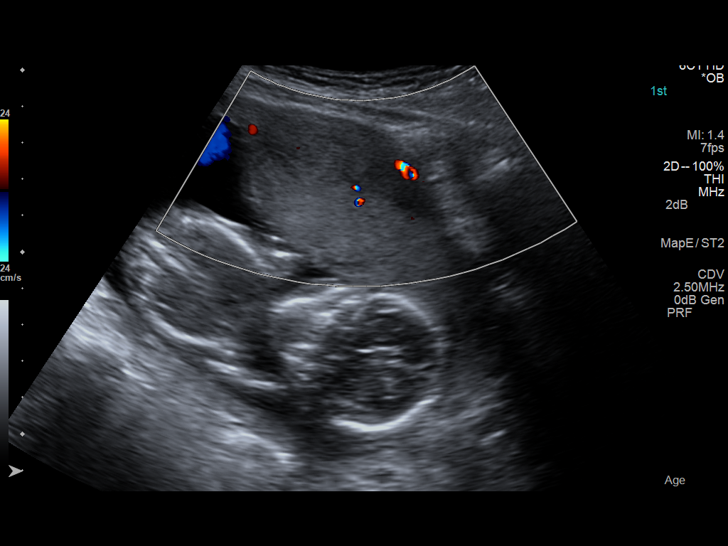
[im 6/29]
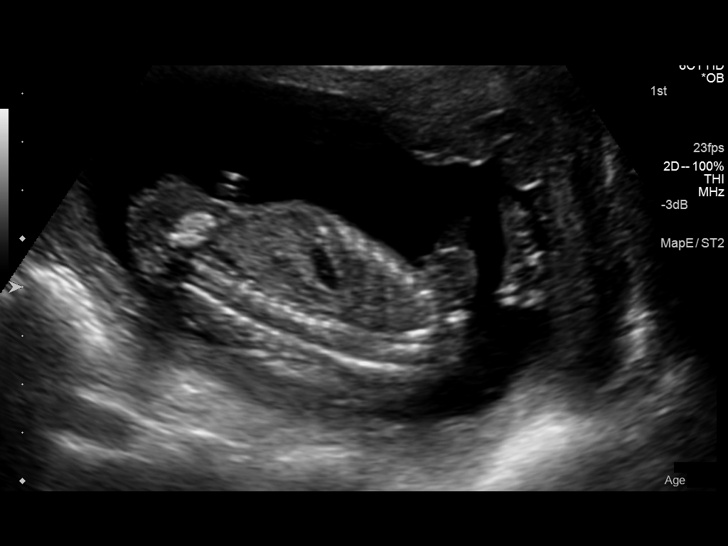
[im 8/29]
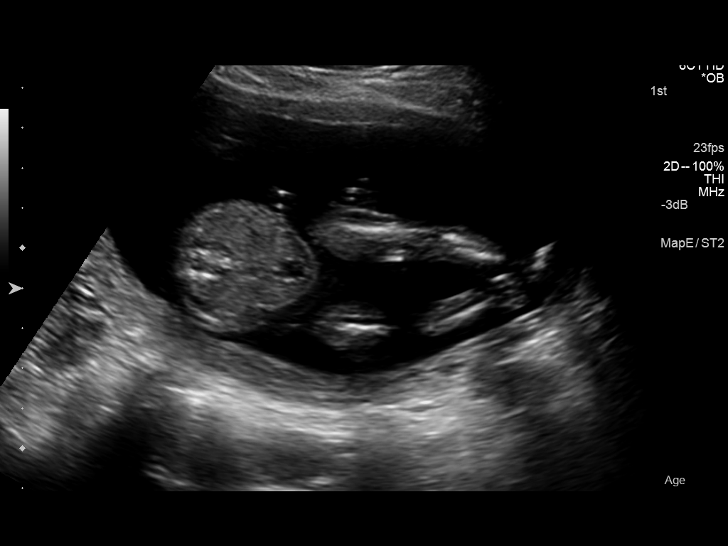
[im 10/29]
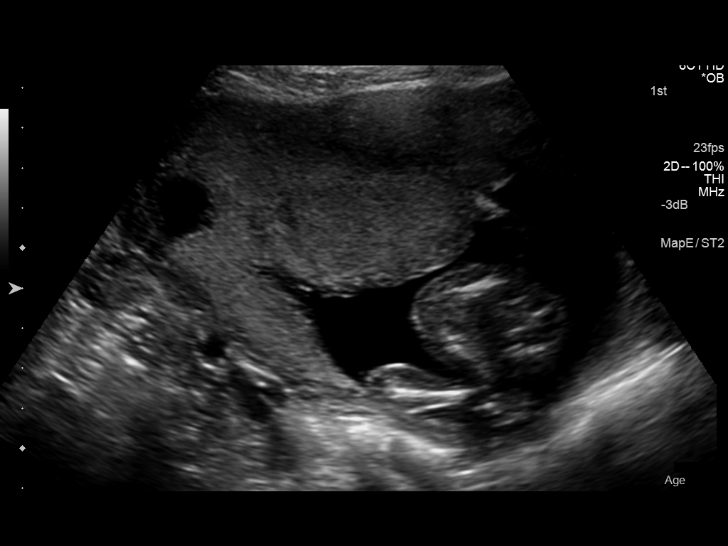
[im 12/29]
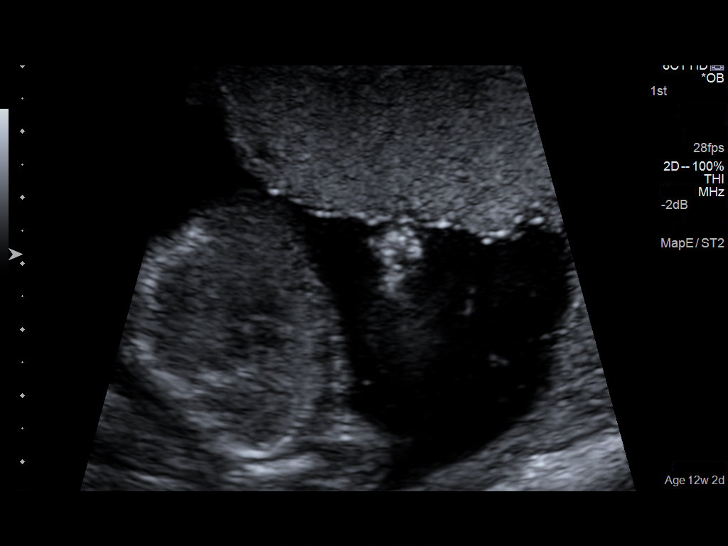
[im 14/29]
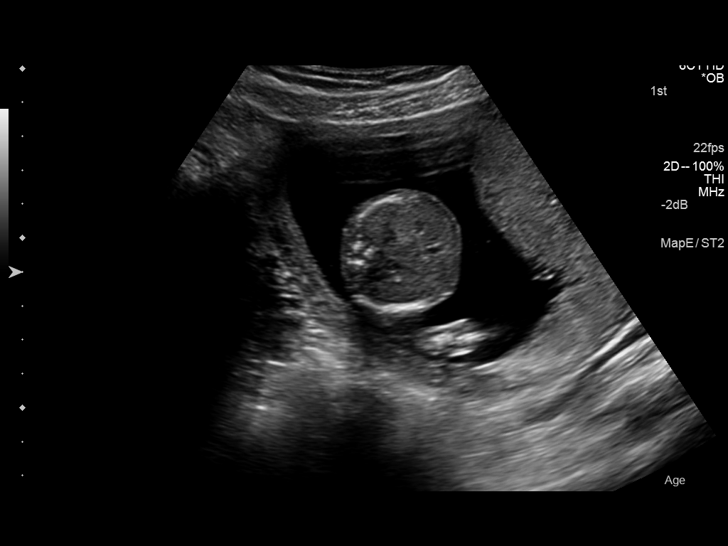
[im 16/29]
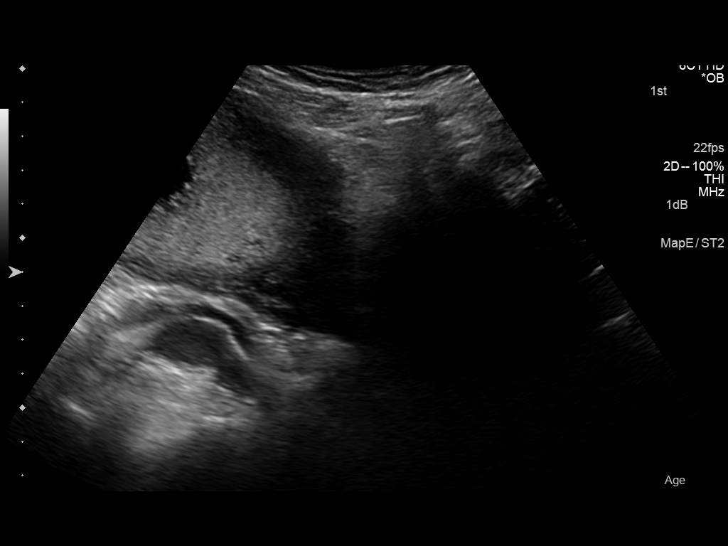
[im 18/29]
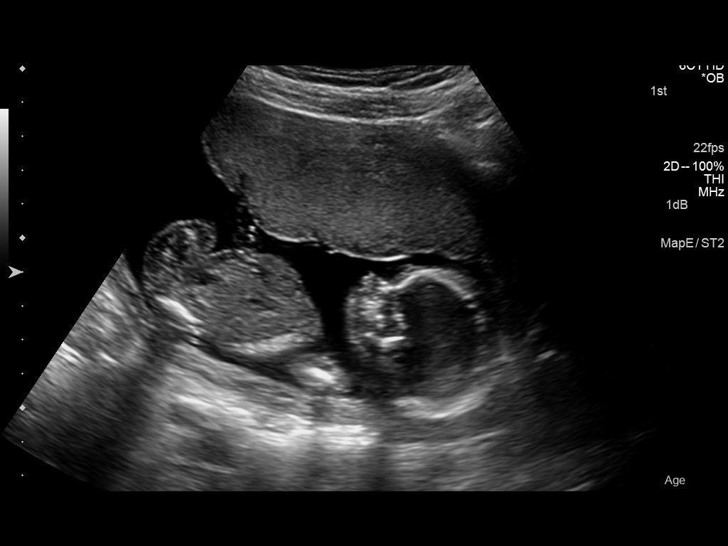
[im 20/29]
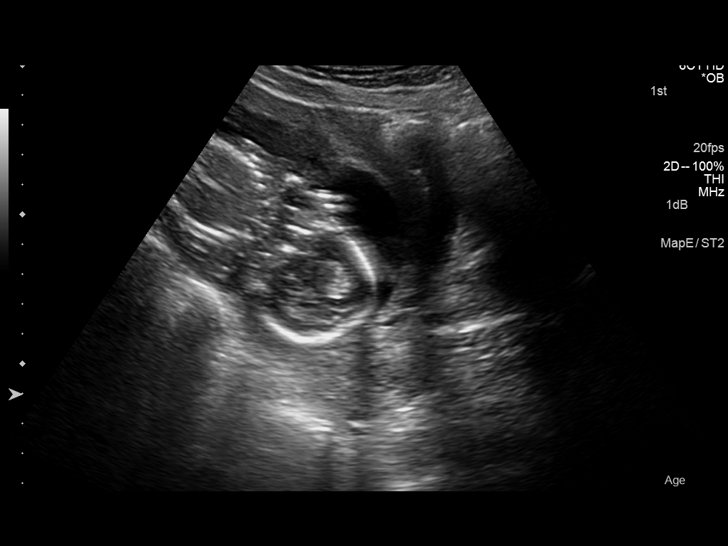
[im 22/29]
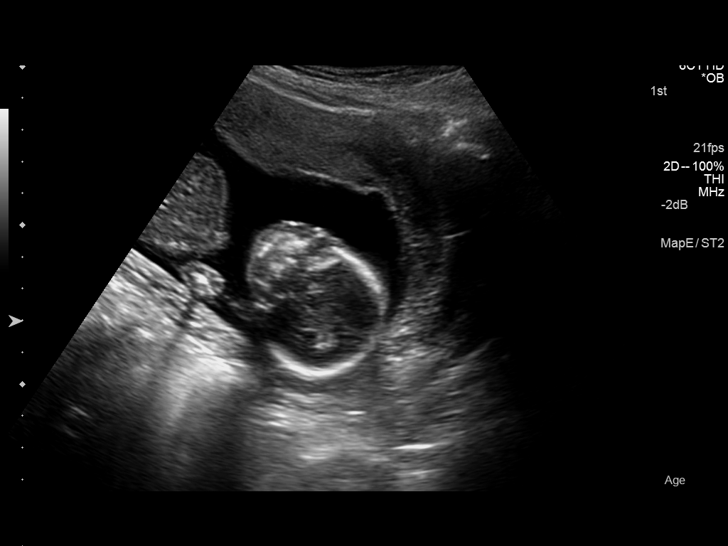
[im 24/29]
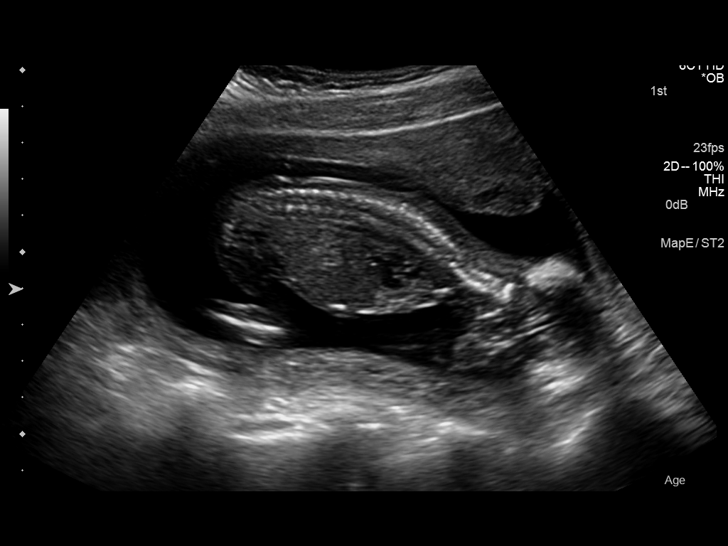
[im 26/29]
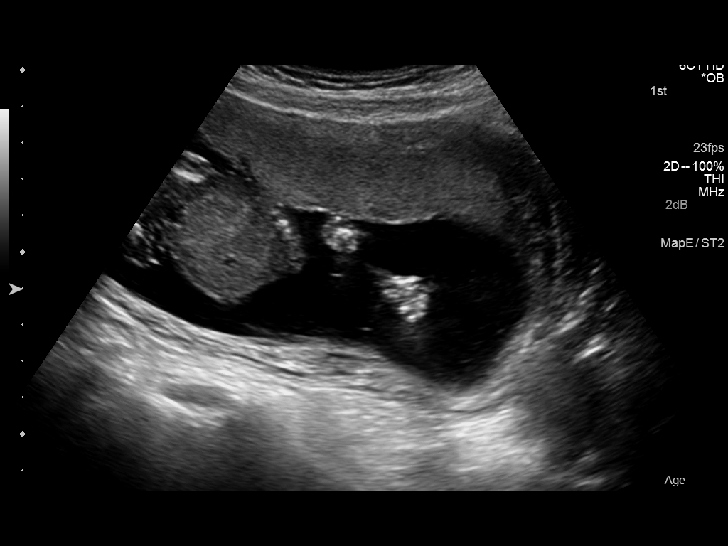
[im 29/29]
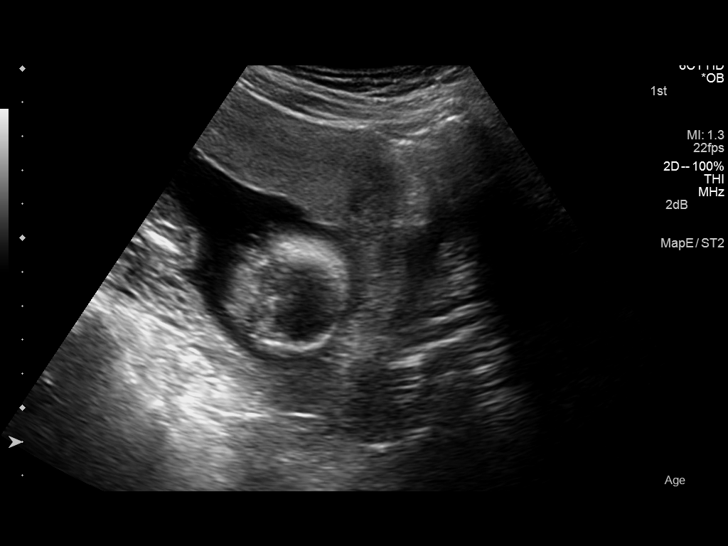

[14 of 28 positions shown; findings below may reference images not displayed]

FINDINGS: Number of Fetuses: 1

Heart Rate:  124 bpm

Presentation: Cephalic

Placental Location: Anterior and LEFT

Previa: No

Amniotic Fluid (Subjective):  Within normal limits.

BPD:  39.4cm 18w  0d

MATERNAL FINDINGS:

Cervix:  Appears closed.

Uterus/Adnexae:  No abnormality visualized.
IMPRESSION: Uncomplicated single intrauterine pregnancy.

This exam is performed on an emergent basis and does not
comprehensively evaluate fetal size, dating, or anatomy; follow-up
complete OB US should be considered if further fetal assessment is
warranted.

## 2016-05-05 IMAGING — US US OB COMP +14 WK
2 series · 12 of 28 positions shown · non-contrast
Comparison: none

[Series 1: us ob comp +14 wk mfm · 83 acquisitions, 11 frames shown (1 of 2)]
[im 4/83]
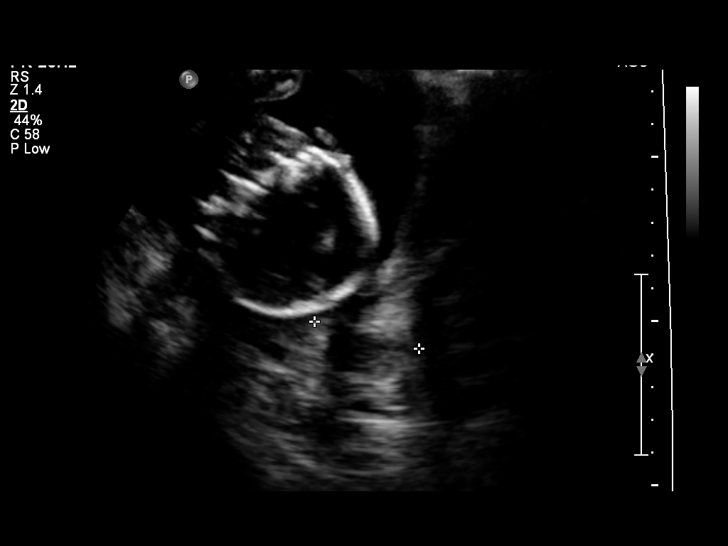
[im 10/83]
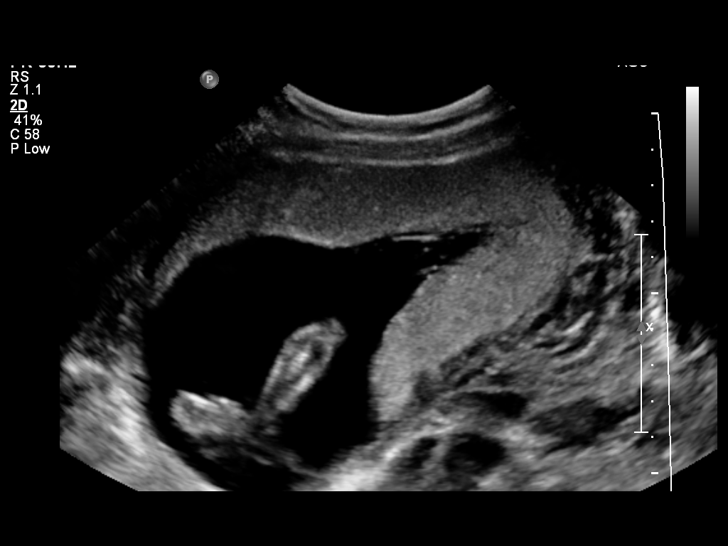
[im 17/83]
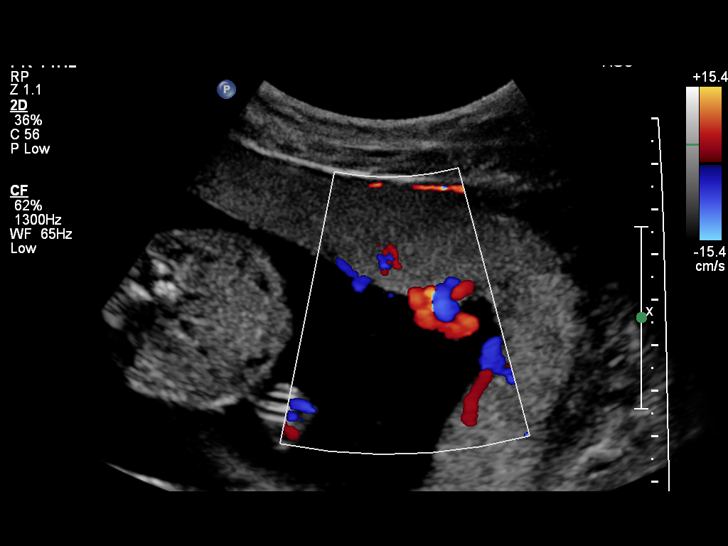
[im 27/83]
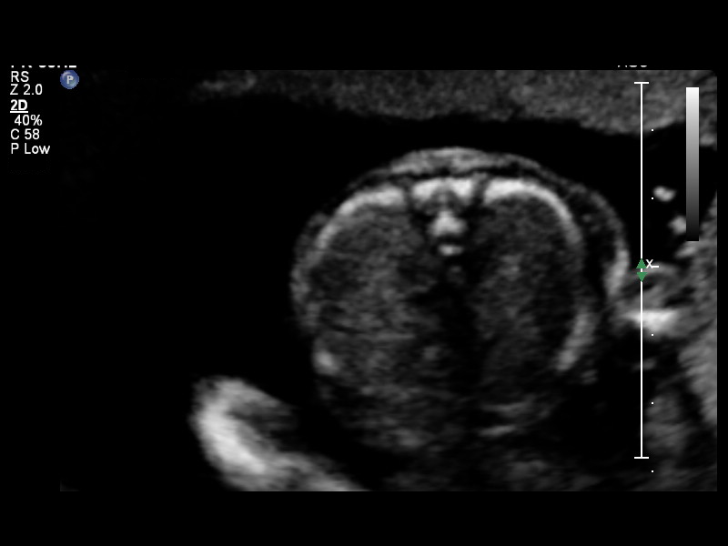
[im 33/83]
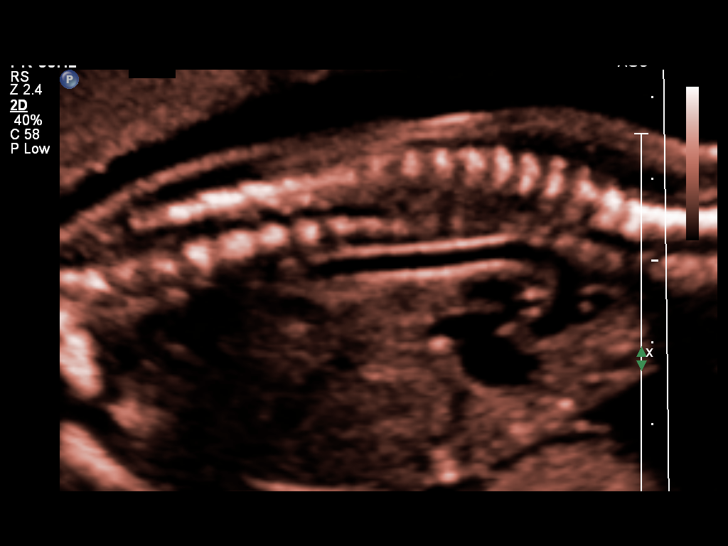
[im 40/83]
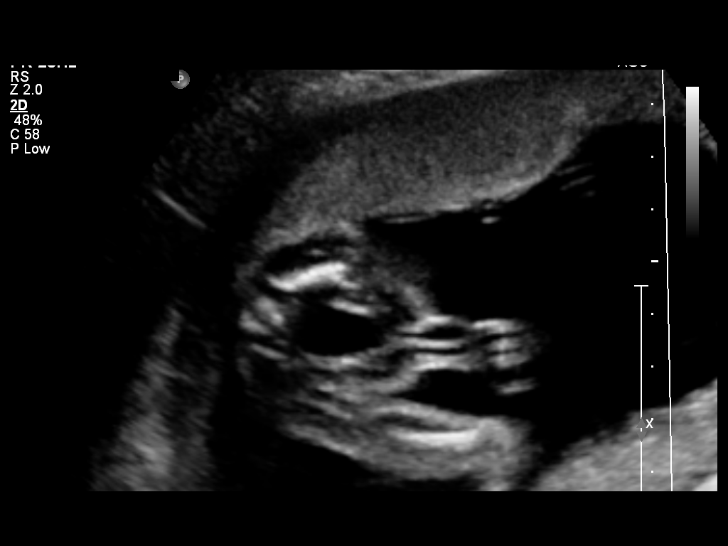
[im 50/83]
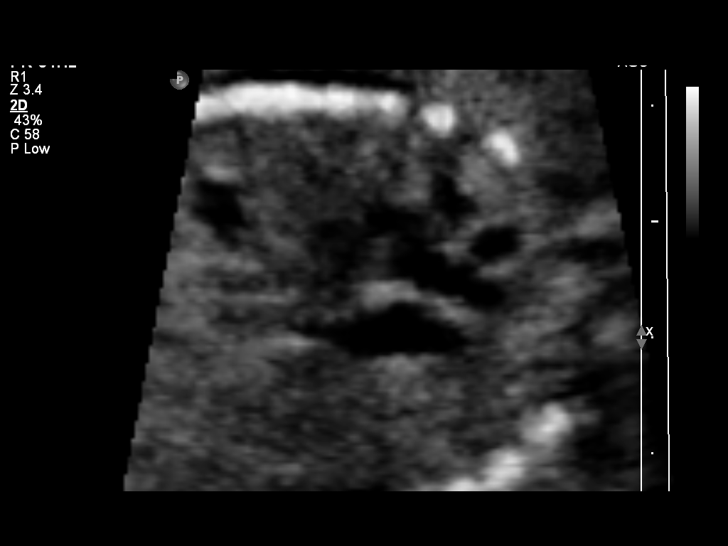
[im 56/83]
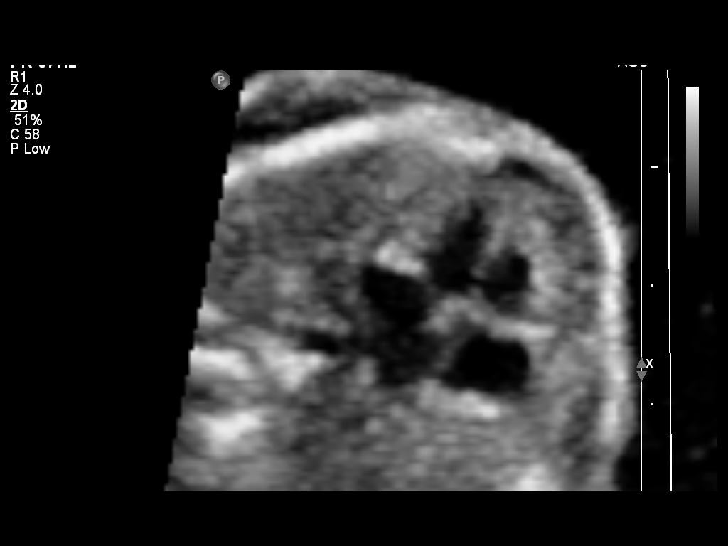
[im 63/83]
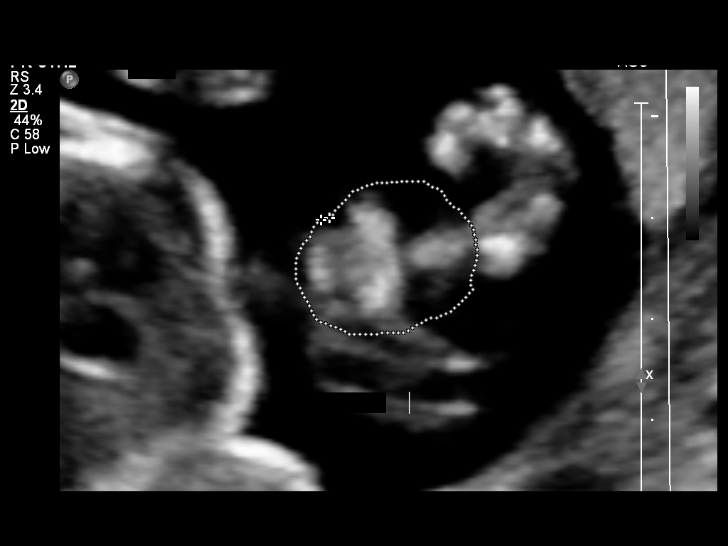
[im 73/83]
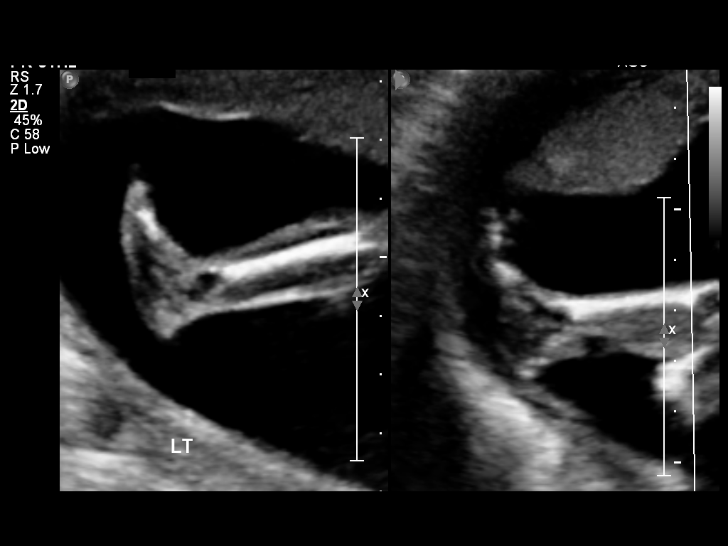
[im 79/83]
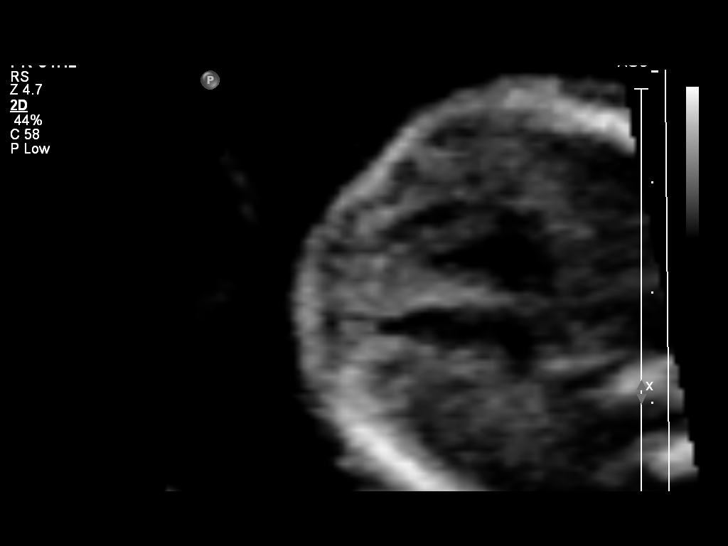

[Series 1: us ob comp +14 wk mfm · 1 of 6 slices shown (2 of 2)]
[im 1/6]
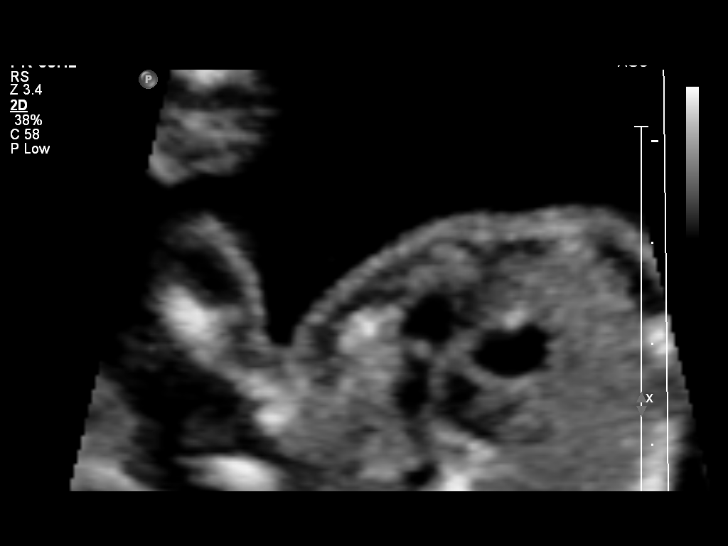

[12 of 28 positions shown; findings below may reference images not displayed]

OBSTETRICS REPORT
                      (Signed Final 06/15/2014 [DATE])

Service(s) Provided

 US OB COMP + 14 WK                                    76805.1
Indications

 No or Little Prenatal Care
 20 weeks gestation of pregnancy
 Basic anatomic survey                                 [id number]
Fetal Evaluation

 Num Of Fetuses:    1
 Fetal Heart Rate:  156                          bpm
 Cardiac Activity:  Observed
 Presentation:      Cephalic
 Placenta:          Anterior, above cervical os
 P. Cord            Visualized, central
 Insertion:

 Amniotic Fluid
 AFI FV:      Subjectively within normal limits
                                             Larg Pckt:    5.56  cm
Biometry

 BPD:     47.8  mm     G. Age:  20w 3d                CI:        69.07   70 - 86
                                                      FL/HC:      17.4   16.8 -

 HC:     183.7  mm     G. Age:  20w 5d       62  %    HC/AC:      1.22   1.09 -

 AC:     150.1  mm     G. Age:  20w 1d       43  %    FL/BPD:
 FL:        32  mm     G. Age:  20w 0d       31  %    FL/AC:      21.3   20 - 24
 HUM:     30.5  mm     G. Age:  20w 1d       46  %
 CER:     21.6  mm     G. Age:  20w 4d       55  %
 Est. FW:     338  gm    0 lb 12 oz      46  %
Gestational Age

 LMP:           18w 2d        Date:  02/06/14                 EDD:   11/13/14
 U/S Today:     20w 2d                                        EDD:   10/30/14
 Best:          20w 2d     Det. By:  U/S (06/14/14)           EDD:   10/30/14
Anatomy
 Cranium:          Appears normal         Aortic Arch:      Appears normal
 Fetal Cavum:      Appears normal         Ductal Arch:      Appears normal
 Ventricles:       Appears normal         Diaphragm:        Appears normal
 Choroid Plexus:   Appears normal         Stomach:          Appears normal, left
                                                            sided
 Cerebellum:       Appears normal         Abdomen:          Appears normal
 Posterior Fossa:  Appears normal         Abdominal Wall:   Appears nml (cord
                                                            insert, abd wall)
 Nuchal Fold:      Appears normal         Cord Vessels:     Appears normal (3
                                                            vessel cord)
 Face:             Appears normal         Kidneys:          Appear normal
                   (orbits and profile)
 Lips:             Appears normal         Bladder:          Appears normal
 Heart:            Appears normal         Spine:            Appears normal
                   (4CH, axis, and
                   situs)
 RVOT:             Appears normal         Lower             Appears normal
                                          Extremities:
 LVOT:             Appears normal         Upper             Appears normal
                                          Extremities:

 Other:  Fetus appears to be a female. Heels visualized.
Targeted Anatomy

 Fetal Central Nervous System
 Lat. Ventricles:  4.3                    Cisterna Magna:
Cervix Uterus Adnexa

 Cervical Length:    3.28     cm

 Cervix:       Normal appearance by transabdominal scan.
 Uterus:       No abnormality visualized.
 Left Ovary:    Size(cm) L: 2.58 x W: 1.82 x H: 1.56  Volume(cc):
 Right Ovary:   Not visualized.

 Adnexa:     No abnormality visualized. No adnexal mass visualized.
Impression

 SIUP at 20+2 weeks
 Normal detailed fetal anatomy
 Normal amniotic fluid volume
 EDC based on today's measurements
Recommendations

 Follow-up as clinically indicated

 questions or concerns.
